# Patient Record
Sex: Male | Born: 1955 | Race: White | Hispanic: No | Marital: Married | Smoking: Never smoker
Health system: Southern US, Community
[De-identification: ages and names within clinical notes are randomized; demographics above are authoritative.]

## PROBLEM LIST (undated history)

## (undated) DIAGNOSIS — I10 Essential (primary) hypertension: Secondary | ICD-10-CM

## (undated) DIAGNOSIS — G4730 Sleep apnea, unspecified: Secondary | ICD-10-CM

## (undated) DIAGNOSIS — F0390 Unspecified dementia without behavioral disturbance: Secondary | ICD-10-CM

## (undated) DIAGNOSIS — M1990 Unspecified osteoarthritis, unspecified site: Secondary | ICD-10-CM

## (undated) DIAGNOSIS — E119 Type 2 diabetes mellitus without complications: Secondary | ICD-10-CM

## (undated) HISTORY — PX: TONSILLECTOMY: SUR1361

## (undated) HISTORY — PX: COLONOSCOPY: SHX174

## (undated) HISTORY — PX: CERVICAL SPINE SURGERY: SHX589

## (undated) HISTORY — PX: HERNIA REPAIR: SHX51

---

## 2003-09-10 ENCOUNTER — Ambulatory Visit (HOSPITAL_COMMUNITY): Admission: RE | Admit: 2003-09-10 | Discharge: 2003-09-11 | Payer: Self-pay | Admitting: Neurosurgery

## 2005-10-21 ENCOUNTER — Emergency Department: Payer: Self-pay | Admitting: Emergency Medicine

## 2005-11-05 ENCOUNTER — Ambulatory Visit: Payer: Self-pay | Admitting: Internal Medicine

## 2009-08-12 ENCOUNTER — Ambulatory Visit: Payer: Self-pay | Admitting: Neurosurgery

## 2009-09-01 ENCOUNTER — Ambulatory Visit: Payer: Self-pay | Admitting: Neurosurgery

## 2009-09-23 ENCOUNTER — Ambulatory Visit: Payer: Self-pay | Admitting: Gastroenterology

## 2009-10-07 ENCOUNTER — Encounter (INDEPENDENT_AMBULATORY_CARE_PROVIDER_SITE_OTHER): Payer: Self-pay | Admitting: Neurosurgery

## 2009-10-07 ENCOUNTER — Ambulatory Visit (HOSPITAL_COMMUNITY): Admission: RE | Admit: 2009-10-07 | Discharge: 2009-10-08 | Payer: Self-pay | Admitting: Neurosurgery

## 2010-09-15 ENCOUNTER — Emergency Department: Payer: Self-pay | Admitting: Emergency Medicine

## 2010-09-16 ENCOUNTER — Emergency Department: Payer: Self-pay | Admitting: Emergency Medicine

## 2010-09-17 ENCOUNTER — Ambulatory Visit: Payer: Self-pay | Admitting: Urology

## 2011-02-02 LAB — BASIC METABOLIC PANEL
BUN: 15 mg/dL (ref 6–23)
CO2: 24 mEq/L (ref 19–32)
Calcium: 9.9 mg/dL (ref 8.4–10.5)
Chloride: 108 mEq/L (ref 96–112)
Creatinine, Ser: 1.01 mg/dL (ref 0.4–1.5)
GFR calc Af Amer: >60 mL/min (ref >60)
GFR calc non Af Amer: >60 mL/min (ref >60)
Glucose, Bld: 83 mg/dL (ref 70–99)
Potassium: 5 mEq/L (ref 3.5–5.1)
Sodium: 139 mEq/L (ref 135–145)

## 2011-02-02 LAB — CBC
HCT: 45.5 % (ref 39.0–52.0)
Hemoglobin: 15.7 g/dL (ref 13.0–17.0)
MCHC: 34.6 g/dL (ref 30.0–36.0)
MCV: 92.9 fL (ref 78.0–100.0)
Platelets: 193 10*3/uL (ref 150–400)
RBC: 4.89 MIL/uL (ref 4.22–5.81)
RDW: 12.6 % (ref 11.5–15.5)
WBC: 5.7 10*3/uL (ref 4.0–10.5)

## 2011-03-19 NOTE — Op Note (Signed)
NAME:  LATRAVION, GRAVES NO.:  [id number]   MEDICAL RECORD NO.:  [id number]                   PATIENT TYPE:  OIB   LOCATION:  2899                                 FACILITY:  [facility]   PHYSICIAN:  Danae Orleans. Venetia Maxon, M.D.               DATE OF BIRTH:  1956-05-21   DATE OF PROCEDURE:  09/10/2003  DATE OF DISCHARGE:                                 OPERATIVE REPORT   PREOPERATIVE DIAGNOSES:  1. Herniated cervical disk with cervical spondylosis.  2. Degenerative disk disease.  3. Cervical radiculopathy C5-6 and C6-7 levels.   POSTOPERATIVE DIAGNOSES:  1. Herniated cervical disk with cervical spondylosis.  2. Degenerative disk disease.  3. Cervical radiculopathy C5-6 and C6-7 levels.   PROCEDURE:  Anterior cervical decompression and fusion C5-6 and C6-7 levels  with Allograft bone graft and anterior cervical plate.   SURGEON:  Danae Orleans. Venetia Maxon, M.D.   ASSISTANT:  Clydene Fake, M.D.   ANESTHESIA:  General endotracheal.   ESTIMATED BLOOD LOSS:  Minimal.   COMPLICATIONS:  None.   DISPOSITION:  To recovery room.   INDICATIONS FOR PROCEDURE:  The patient is a 55 year old man with  significant cervical spondylosis at C5-6 and C6-7 levels with biforaminal  stenosis and bilateral upper extremity pain and weakness that has been  asymptomatic for several years and has gradually gotten worse.  He has done  a variety of nonsurgical treatments and feels it is now interfering with his  ability to function.  It was therefore elected to take him to surgery for  anterior cervical decompression and fusion at the effected levels.   DESCRIPTION OF PROCEDURE:  The patient was brought to the operating room.  Following satisfactory and uncomplicated induction of general endotracheal  anesthesia and placement of intravenous lines, the patient was placed in the  supine position on the operating table.  His neck was placed in slight  extension.  He was placed in 10 pounds  of Holter traction.  His anterior  neck was prepped and draped in the usual sterile fashion.  Area of plain  incision was infiltrated with 0.25% Marcaine and 0.5% Lidocaine with  1:200,000 epinephrine.   An incision was made from the midline to the anterior border of the  sternocleidomastoid muscle.  Dissection was performed exposing the anterior  border of the sternocleidomastoid muscle using blunt dissection.  The  carotid sheath was kept lateral.  The trachea and esophagus were kept medial  exposing the anterior cervical spine.  A bent spinal needle was placed at  what was felt to be the C5-6 level and this was confirmed on intraoperative  x-ray.  Subsequently, the longus coli muscles were taken down from the  anterior cervical spine from C5 through C7 levels bilaterally using  electrocautery and key elevator.  Self-retaining shadow line retractor was  placed along with up-down retractor.  The C5-6 and C6-7 levels were incised  with a 15 blade and disk material was removed in a piecemeal fashion.  Endplates were stripped of residual disk material.  Disk space spreaders  were placed to further decompress the interspace.  The C5-6 level was highly  spondylitic with marked uncinate spurring and this was drilled down using A2  equivalent bur.  High speed drill under direct microscopic visualization.   Subsequently, the posterior longitudinal ligament was incised with an  arachnoid knife and removed in piecemeal fashion and the central spinal cord  dura was decompressed as were both C6 nerve roots.  This was extended up the  neuroforamen.  Hemostasis was obtained with Gelfoam soaked in thrombin.  After trial sizing a 7 mm __________ a cancellous Allograft bone wedge was  then hydrated, inserted in the interspace, and counter sunk appropriately.  Attention was then turned to the C6-7 level where similar decompression was  performed and again both C7 nerve roots were decompressed as was the  central  spinal cord dura.  Hemostasis was again assured and a similarly sized bone  graft was rehydrated, inserted in the interspace, and counter sunk  appropriately.  Subsequently a 40 mm Trinica anterior cervical plate was  affixed to the anterior cervical spine using 14 mm variable angle screws,  two at C5, two at C6, and two at C7. All screws had excellent purchase.  Locking mechanisms were engaged.  Final x-ray confirmed positioning of bone  graft in the anterior cervical plate.   The upper aspect of the construct and the lower aspect were not completely  visualized.  Hemostasis was again assured and the wound was copiously  irrigated with bacitracin and saline.  Soft tissues were inspected and found  to be in good repair.  Subsequently, the platysmal layer was closed with 3-0  Vicryl sutures.  The skin edges were reapproximated with running 4-0 Vicryl  subcuticular stitches.  The wound was dressed with Dermabond.   The patient was extubated in the operating room and taken to the recovery  room in stable and satisfactory condition, having tolerated the procedure  well.  Needle, sponge, and instrument counts correct at the end of the case.                                               Danae Orleans. Venetia Maxon, M.D.    JDS/MEDQ  D:  09/10/2003  T:  09/10/2003  Job:  [id number]

## 2011-12-14 ENCOUNTER — Emergency Department: Payer: Self-pay | Admitting: Emergency Medicine

## 2011-12-14 LAB — CBC
HCT: 43.8 % (ref 40.0–52.0)
HGB: 14.7 g/dL (ref 13.0–18.0)
MCH: 31.2 pg (ref 26.0–34.0)
MCHC: 33.5 g/dL (ref 32.0–36.0)
MCV: 93 fL (ref 80–100)
Platelet: 206 10*3/uL (ref 150–440)
RBC: 4.71 10*6/uL (ref 4.40–5.90)
RDW: 12.5 % (ref 11.5–14.5)
WBC: 7.1 10*3/uL (ref 3.8–10.6)

## 2011-12-14 LAB — COMPREHENSIVE METABOLIC PANEL
Albumin: 4.3 g/dL (ref 3.4–5.0)
Alkaline Phosphatase: 79 U/L (ref 50–136)
Anion Gap: 12 (ref 7–16)
BUN: 19 mg/dL — ABNORMAL HIGH (ref 7–18)
Bilirubin,Total: 0.7 mg/dL (ref 0.2–1.0)
Calcium, Total: 9.4 mg/dL (ref 8.5–10.1)
Chloride: 98 mmol/L (ref 98–107)
Co2: 26 mmol/L (ref 21–32)
Creatinine: 1.14 mg/dL (ref 0.60–1.30)
EGFR (African American): >60
EGFR (Non-African Amer.): >60
Glucose: 351 mg/dL — ABNORMAL HIGH (ref 65–99)
Osmolality: 288 (ref 275–301)
Potassium: 4 mmol/L (ref 3.5–5.1)
SGOT(AST): 23 U/L (ref 15–37)
SGPT (ALT): 32 U/L
Sodium: 136 mmol/L (ref 136–145)
Total Protein: 7.8 g/dL (ref 6.4–8.2)

## 2011-12-14 LAB — URINALYSIS, COMPLETE
Bacteria: NONE SEEN
Bilirubin,UR: NEGATIVE
Blood: NEGATIVE
Glucose,UR: >=500 mg/dL (ref 0–75)
Leukocyte Esterase: NEGATIVE
Nitrite: NEGATIVE
Ph: 5 (ref 4.5–8.0)
Protein: NEGATIVE
RBC,UR: 1 /HPF (ref 0–5)
Specific Gravity: 1.038 (ref 1.003–1.030)
Squamous Epithelial: NONE SEEN
WBC UR: 1 /HPF (ref 0–5)

## 2011-12-14 LAB — TROPONIN I: Troponin-I: <0.02 ng/mL

## 2012-08-29 ENCOUNTER — Ambulatory Visit: Payer: Self-pay | Admitting: Ophthalmology

## 2012-09-11 ENCOUNTER — Ambulatory Visit: Payer: Self-pay | Admitting: Ophthalmology

## 2013-08-20 ENCOUNTER — Ambulatory Visit: Payer: Self-pay | Admitting: Ophthalmology

## 2013-08-20 DIAGNOSIS — I10 Essential (primary) hypertension: Secondary | ICD-10-CM

## 2013-08-27 ENCOUNTER — Ambulatory Visit: Payer: Self-pay | Admitting: Ophthalmology

## 2015-02-18 NOTE — Op Note (Signed)
PATIENT NAME:  Alan Nunez, Alan Nunez MR#:  [id number] DATE OF BIRTH:  11/02/1955  DATE OF PROCEDURE:  09/11/2012  PREOPERATIVE DIAGNOSIS:  Cataract, right eye.   POSTOPERATIVE DIAGNOSIS:  Cataract, right eye.  PROCEDURE PERFORMED:  Extracapsular cataract extraction using phacoemulsification with placement of an Alcon SN6CWS, 18.5-diopter posterior chamber lens, serial # [id number].   SURGEON:  Maylon PeppersSteven A. Christien Frankl, MD  ASSISTANT:  None.  ANESTHESIA:  4% lidocaine and 0.75% Marcaine in a 50/50 mixture with 10 units/mL of Hylenex added, given as peribulbar.  ANESTHESIOLOGIST:  Dr. Dimple Caseyice   COMPLICATIONS:  None.  ESTIMATED BLOOD LOSS:  Less than 1 mL.  DESCRIPTION OF PROCEDURE:  The patient was brought to the operating room and given a peribulbar block.  The patient was then prepped and draped in the usual fashion.  The vertical rectus muscles were imbricated using 5-0 silk sutures.  These sutures were then clamped to the sterile drapes as bridle sutures.  A limbal peritomy was performed extending two clock hours and hemostasis was obtained with cautery.  A partial thickness scleral groove was made at the surgical limbus and dissected anteriorly in a lamellar dissection using an Alcon crescent knife.  The anterior chamber was entered superonasally with a Superblade and through the lamellar dissection with a 2.6 mm keratome.  DisCoVisc was used to replace the aqueous and a continuous tear capsulorrhexis was carried out.  Hydrodissection and hydrodelineation were carried out with balanced salt and a 27 gauge canula.  The nucleus was rotated to confirm the effectiveness of the hydrodissection.  Phacoemulsification was carried out using a divide-and-conquer technique.  Total ultrasound time was 1 minute and 10 seconds with an average power of 18.2 percent, CDE 22.23.  Irrigation/aspiration was used to remove the residual cortex.  DisCoVisc was used to inflate the capsule and the internal incision was  enlarged to 3 mm with the crescent knife.  The intraocular lens was folded and inserted into the capsular bag using the AcrySert delivery system.  Irrigation/aspiration was used to remove the residual DisCoVisc.  Miostat was injected into the anterior chamber through the paracentesis track to inflate the anterior chamber and induce miosis.  The wound was checked for leaks and none were found. The conjunctiva was closed with cautery and the bridle sutures were removed.  Two drops of 0.3% Vigamox were placed on the eye.   An eye shield was placed on the eye.  The patient was discharged to the recovery room in good condition.  ____________________________ Maylon PeppersSteven A. Joseeduardo Brix, MD sad:drc Nunez: 09/11/2012 13:12:45 ET T: 09/11/2012 13:26:16 ET JOB#: [id number]  cc: Viviann SpareSteven A. Jazz Biddy, MD, <Dictator> Erline LevineSTEVEN A Fuller Makin MD ELECTRONICALLY SIGNED 09/25/2012 14:39

## 2015-02-21 NOTE — Op Note (Signed)
PATIENT NAME:  Alan Nunez, Alan Nunez MR#:  [id number] DATE OF BIRTH:  1955/12/30  DATE OF PROCEDURE:  08/27/2013  PREOPERATIVE DIAGNOSIS: Cataract, left eye.   POSTOPERATIVE DIAGNOSIS:  Cataract, left eye.    PROCEDURE PERFORMED: Extracapsular cataract extraction using phacoemulsification with placement of Alcon SN6CWS, 19.0 diopter posterior chamber lens, serial number [id number].   SURGEON: Maylon PeppersSteven A. Ellisyn Icenhower, M.Nunez.   ANESTHESIA: Lidocaine 4% and 0.75% Marcaine, a 50-50 mixture with 10 units/mL of Hylenex added given as a peribulbar.   ANESTHESIOLOGIST: Dr. Darleene CleaverVan Staveren.   COMPLICATIONS: None.   ESTIMATED BLOOD LOSS: Less than 1 mL.   DESCRIPTION OF PROCEDURE:  The patient was brought to the operating room and given a peribulbar block.  The patient was then prepped and draped in the usual fashion.  The vertical rectus muscles were imbricated using 5-0 silk sutures.  These sutures were then clamped to the sterile drapes as bridle sutures.  A limbal peritomy was performed extending two clock hours and hemostasis was obtained with cautery.  A partial thickness scleral groove was made at the surgical limbus and dissected anteriorly in a lamellar dissection using an Alcon crescent knife.  The anterior chamber was entered supero-temporally with a Superblade and through the lamellar dissection with a 2.6 mm keratome.  DisCoVisc was used to replace the aqueous and a continuous tear capsulorrhexis was carried out.  Hydrodissection and hydrodelineation were carried out with balanced salt and a 27 gauge canula.  The nucleus was rotated to confirm the effectiveness of the hydrodissection.  Phacoemulsification was carried out using a divide-and-conquer technique.  Total ultrasound time was 47.3 seconds with an average power of 16%.  CDE 11.24.    Irrigation/aspiration was used to remove the residual cortex.  DisCoVisc was used to inflate the capsule and the internal incision was enlarged to 3 mm with the  crescent knife.  The intraocular lens was folded and inserted into the capsular bag using an AcrySert delivery system.  Irrigation/aspiration was used to remove the residual DisCoVisc.  Miostat was injected into the anterior chamber through the paracentesis track to inflate the anterior chamber and induce miosis.  Cefuroxime 0.1 mL was injected via the paracentesis tract.  The wound was checked for leaks and none were found. The conjunctiva was closed with cautery and the bridle sutures were removed.  Two drops of 0.3% Vigamox were placed on the eye.   An eye shield was placed on the eye.  The patient was discharged to the recovery room in good condition.   ____________________________ Maylon PeppersSteven A. Monika Chestang, MD sad:cs Nunez: 08/27/2013 13:30:09 ET T: 08/27/2013 20:36:06 ET JOB#: [id number]  cc: Viviann SpareSteven A. Kadarius Cuffe, MD, <Dictator> Erline LevineSTEVEN A Eriyanna Kofoed MD ELECTRONICALLY SIGNED 09/03/2013 8:24

## 2018-02-25 ENCOUNTER — Encounter: Payer: Self-pay | Admitting: Emergency Medicine

## 2018-02-25 ENCOUNTER — Emergency Department
Admission: EM | Admit: 2018-02-25 | Discharge: 2018-02-25 | Disposition: A | Discharge status: Home / Self Care | Payer: Managed Care, Other (non HMO) | Attending: Emergency Medicine | Admitting: Emergency Medicine

## 2018-02-25 ENCOUNTER — Other Ambulatory Visit: Payer: Self-pay

## 2018-02-25 ENCOUNTER — Emergency Department: Payer: Managed Care, Other (non HMO)

## 2018-02-25 DIAGNOSIS — I10 Essential (primary) hypertension: Secondary | ICD-10-CM | POA: Diagnosis not present

## 2018-02-25 DIAGNOSIS — E119 Type 2 diabetes mellitus without complications: Secondary | ICD-10-CM | POA: Diagnosis not present

## 2018-02-25 DIAGNOSIS — N200 Calculus of kidney: Secondary | ICD-10-CM

## 2018-02-25 DIAGNOSIS — R1084 Generalized abdominal pain: Secondary | ICD-10-CM | POA: Diagnosis present

## 2018-02-25 HISTORY — DX: Type 2 diabetes mellitus without complications: E11.9

## 2018-02-25 HISTORY — DX: Essential (primary) hypertension: I10

## 2018-02-25 LAB — CBC
HCT: 39.7 % — ABNORMAL LOW (ref 40.0–52.0)
Hemoglobin: 13.4 g/dL (ref 13.0–18.0)
MCH: 30.2 pg (ref 26.0–34.0)
MCHC: 33.7 g/dL (ref 32.0–36.0)
MCV: 89.4 fL (ref 80.0–100.0)
Platelets: 244 10*3/uL (ref 150–440)
RBC: 4.44 MIL/uL (ref 4.40–5.90)
RDW: 13.3 % (ref 11.5–14.5)
WBC: 8.5 10*3/uL (ref 3.8–10.6)

## 2018-02-25 LAB — COMPREHENSIVE METABOLIC PANEL
ALT: 25 U/L (ref 17–63)
AST: 28 U/L (ref 15–41)
Albumin: 4.2 g/dL (ref 3.5–5.0)
Alkaline Phosphatase: 131 U/L — ABNORMAL HIGH (ref 38–126)
Anion gap: 10 (ref 5–15)
BUN: 26 mg/dL — ABNORMAL HIGH (ref 6–20)
CO2: 23 mmol/L (ref 22–32)
Calcium: 9.5 mg/dL (ref 8.9–10.3)
Chloride: 100 mmol/L — ABNORMAL LOW (ref 101–111)
Creatinine, Ser: 1.09 mg/dL (ref 0.61–1.24)
GFR calc Af Amer: >60 mL/min (ref >60)
GFR calc non Af Amer: >60 mL/min (ref >60)
Glucose, Bld: 279 mg/dL — ABNORMAL HIGH (ref 65–99)
Potassium: 4.3 mmol/L (ref 3.5–5.1)
Sodium: 133 mmol/L — ABNORMAL LOW (ref 135–145)
Total Bilirubin: 1 mg/dL (ref 0.3–1.2)
Total Protein: 6.9 g/dL (ref 6.5–8.1)

## 2018-02-25 LAB — URINALYSIS, COMPLETE (UACMP) WITH MICROSCOPIC
Bacteria, UA: NONE SEEN
Bilirubin Urine: NEGATIVE
Glucose, UA: >=500 mg/dL — AB
Ketones, ur: 20 mg/dL — AB
Leukocytes, UA: NEGATIVE
Nitrite: NEGATIVE
Protein, ur: NEGATIVE mg/dL
Specific Gravity, Urine: 1.016 (ref 1.005–1.030)
Squamous Epithelial / LPF: NONE SEEN (ref 0–5)
pH: 6 (ref 5.0–8.0)

## 2018-02-25 LAB — LIPASE, BLOOD: Lipase: 22 U/L (ref 11–51)

## 2018-02-25 MED ORDER — IBUPROFEN 600 MG PO TABS: 600.0000 mg | ORAL_TABLET | Freq: Three times a day (TID) | ORAL | 0 refills | Status: DC | PRN

## 2018-02-25 MED ORDER — HYDROCODONE-ACETAMINOPHEN 5-325 MG PO TABS: 1.0000 | ORAL_TABLET | Freq: Four times a day (QID) | ORAL | 0 refills | Status: DC | PRN

## 2018-02-25 MED ORDER — IOPAMIDOL (ISOVUE-370) INJECTION 76%
75.0000 mL | Freq: Once | INTRAVENOUS | Status: AC | PRN
  Administered 2018-02-25: 75 mL via INTRAVENOUS

## 2018-02-25 MED ORDER — KETOROLAC TROMETHAMINE 30 MG/ML IJ SOLN
15.0000 mg | Freq: Once | INTRAMUSCULAR | Status: AC
  Administered 2018-02-25: 15 mg via INTRAVENOUS
  Filled 2018-02-25: qty 1

## 2018-02-25 MED ORDER — ONDANSETRON HCL 4 MG/2ML IJ SOLN
4.0000 mg | Freq: Once | INTRAMUSCULAR | Status: AC
  Administered 2018-02-25: 4 mg via INTRAVENOUS
  Filled 2018-02-25: qty 2

## 2018-02-25 MED ORDER — FENTANYL CITRATE (PF) 100 MCG/2ML IJ SOLN
75.0000 ug | Freq: Once | INTRAMUSCULAR | Status: AC
  Administered 2018-02-25: 75 ug via INTRAVENOUS
  Filled 2018-02-25: qty 2

## 2018-02-25 MED ORDER — TAMSULOSIN HCL 0.4 MG PO CAPS
0.4000 mg | ORAL_CAPSULE | Freq: Every day | ORAL | 0 refills | Status: DC
Start: 2018-02-25 — End: 2022-12-29

## 2018-02-25 NOTE — ED Notes (Signed)

## 2018-02-25 NOTE — ED Triage Notes (Signed)
Pt to ED via POV c/o severe abdominal pain that started around 0400. Pt states that he has also had N/V, has had 3 episodes of vomiting. Pt states that the pain comes and goes. Pt denies diarrhea. Pt states that he feels like he is constipated. Pt appears uncomfortable at this time but is in NAD.

## 2018-02-25 NOTE — Discharge Instructions (Signed)
Please take Flomax every day and take your pain medication as needed for severe symptoms.  Follow-up with urology within 1 week for any concerns and return to the emergency department for any issues whatsoever.  It was a pleasure to take care of you today, and thank you for coming to our emergency department.  If you have any questions or concerns before leaving please ask the nurse to grab me and I'm more than happy to go through your aftercare instructions again.  If you were prescribed any opioid pain medication today such as Norco, Vicodin, Percocet, morphine, hydrocodone, or oxycodone please make sure you do not drive when you are taking this medication as it can alter your ability to drive safely.  If you have any concerns once you are home that you are not improving or are in fact getting worse before you can make it to your follow-up appointment, please do not hesitate to call [phone] and come back for further evaluation.  Merrily Brittle, MD  Results for orders placed or performed during the hospital encounter of 02/25/18  Lipase, blood  Result Value Ref Range   Lipase 22 11 - 51 U/L  Comprehensive metabolic panel  Result Value Ref Range   Sodium 133 (L) 135 - 145 mmol/L   Potassium 4.3 3.5 - 5.1 mmol/L   Chloride 100 (L) 101 - 111 mmol/L   CO2 23 22 - 32 mmol/L   Glucose, Bld 279 (H) 65 - 99 mg/dL   BUN 26 (H) 6 - 20 mg/dL   Creatinine, Ser 4.54 0.61 - 1.24 mg/dL   Calcium 9.5 8.9 - 09.8 mg/dL   Total Protein 6.9 6.5 - 8.1 g/dL   Albumin 4.2 3.5 - 5.0 g/dL   AST 28 15 - 41 U/L   ALT 25 17 - 63 U/L   Alkaline Phosphatase 131 (H) 38 - 126 U/L   Total Bilirubin 1.0 0.3 - 1.2 mg/dL   GFR calc non Af Amer >60 >60 mL/min   GFR calc Af Amer >60 >60 mL/min   Anion gap 10 5 - 15  CBC  Result Value Ref Range   WBC 8.5 3.8 - 10.6 K/uL   RBC 4.44 4.40 - 5.90 MIL/uL   Hemoglobin 13.4 13.0 - 18.0 g/dL   HCT 11.9 (L) 14.7 - 82.9 %   MCV 89.4 80.0 - 100.0 fL   MCH 30.2 26.0 - 34.0 pg   MCHC 33.7 32.0 - 36.0 g/dL   RDW 56.2 13.0 - 86.5 %   Platelets 244 150 - 440 K/uL  Urinalysis, Complete w Microscopic  Result Value Ref Range   Color, Urine YELLOW (A) YELLOW   APPearance CLEAR (A) CLEAR   Specific Gravity, Urine 1.016 1.005 - 1.030   pH 6.0 5.0 - 8.0   Glucose, UA >=500 (A) NEGATIVE mg/dL   Hgb urine dipstick LARGE (A) NEGATIVE   Bilirubin Urine NEGATIVE NEGATIVE   Ketones, ur 20 (A) NEGATIVE mg/dL   Protein, ur NEGATIVE NEGATIVE mg/dL   Nitrite NEGATIVE NEGATIVE   Leukocytes, UA NEGATIVE NEGATIVE   RBC / HPF 21-50 0 - 5 RBC/hpf   WBC, UA 0-5 0 - 5 WBC/hpf   Bacteria, UA NONE SEEN NONE SEEN   Squamous Epithelial / LPF NONE SEEN 0 - 5   Ct Abdomen Pelvis W Contrast  Result Date: 02/25/2018 CLINICAL DATA:  Pt to ED via POV c/o severe abdominal pain that started around 0400. Pt states that he has also had N/V, has  had 3 episodes of vomiting. Pt states that the pain comes and goes. Pt denies diarrhea. Pt states that he feels like he is constipated. EXAM: CT ABDOMEN AND PELVIS WITH CONTRAST TECHNIQUE: Multidetector CT imaging of the abdomen and pelvis was performed using the standard protocol following bolus administration of intravenous contrast. CONTRAST:  75mL ISOVUE-370 IOPAMIDOL (ISOVUE-370) INJECTION 76% COMPARISON:  09/15/2010 FINDINGS: Lower chest: Clear lung bases. Hepatobiliary: Liver normal in size. Small area of focal fat adjacent to the falciform ligament. No liver masses. There are dependent gallstones. No evidence of acute cholecystitis. No bile duct dilation. Pancreas: Unremarkable. No pancreatic ductal dilatation or surrounding inflammatory changes. Spleen: Normal in size without focal abnormality. Adrenals/Urinary Tract: No adrenal masses. Moderate left hydronephrosis. Left ureter is mildly dilated. Obstruction is due to a 3 mm stone at the left ureterovesicular junction. There is left perinephric stranding. Small nonobstructing stone in the upper pole the left  kidney. Several small nonobstructing stones in the right kidney. No right AND for a CIS. 6 mm low-density mass arises from the upper pole the right kidney consistent with a cyst. No other definite masses. Right ureters normal course and in caliber. Bladder is unremarkable. Stomach/Bowel: Normal stomach and small bowel. Most of the colon is decompressed. There are scattered colonic diverticula. No diverticulitis or other colonic inflammatory process. No increase in colonic stool burden. Normal appendix visualized. Vascular/Lymphatic: Aortic atherosclerosis. No enlarged abdominal or pelvic lymph nodes. Reproductive: Prostate is mildly enlarged. Other: No abdominal wall hernia or abnormality. No abdominopelvic ascites. Musculoskeletal: No fracture or acute finding. No osteoblastic or osteolytic lesions. IMPRESSION: 1. 3 mm stone at the left ureterovesicular junction causes moderate hydronephrosis. 2. No other acute abnormality. 3. Small bilateral nonobstructing intrarenal stones. 4. Gallstones. 5. No increase in colonic stool burden. 6. Scattered colonic diverticula.  No diverticulitis. 7. Mild aortic atherosclerosis. Electronically Signed   By: Amie Portland M.D.   On: 02/25/2018 15:24

## 2018-02-25 NOTE — ED Provider Notes (Signed)
[facility] Emergency Department Provider Note  ____________________________________________   First MD Initiated Contact with Patient 02/25/18 1357     (approximate)  I have reviewed the triage vital signs and the nursing notes.   HISTORY  Chief Complaint Abdominal Pain    HPI Alan Nunez is a 62 y.o. male is self presents to the emergency department with severe abdominal pain that began early this morning around 4 in the morning.  Associated with nausea and vomiting.  The pain is intermittent seems to come and go.  It on his left side radiating towards his groin.  He denies dysuria frequency hesitancy.  He does report some back pain.  He denies fevers or chills.  Past Medical History:  Diagnosis Date  . Diabetes mellitus without complication (HCC)   . Hypertension     There are no active problems to display for this patient.   Past Surgical History:  Procedure Laterality Date  . HERNIA REPAIR      Prior to Admission medications   Medication Sig Start Date End Date Taking? Authorizing Provider  HYDROcodone-acetaminophen (NORCO) 5-325 MG tablet Take 1 tablet by mouth every 6 (six) hours as needed for up to 7 doses for severe pain. 02/25/18   Merrily Brittle, MD  ibuprofen (ADVIL,MOTRIN) 600 MG tablet Take 1 tablet (600 mg total) by mouth every 8 (eight) hours as needed. 02/25/18   Merrily Brittle, MD  tamsulosin (FLOMAX) 0.4 MG CAPS capsule Take 1 capsule (0.4 mg total) by mouth daily. 02/25/18   Merrily Brittle, MD    Allergies Codeine and Morphine and related  No family history on file.  Social History Social History   Tobacco Use  . Smoking status: Never Smoker  . Smokeless tobacco: Never Used  Substance Use Topics  . Alcohol use: Not Currently  . Drug use: Not Currently    Review of Systems Constitutional: No fever/chills Eyes: No visual changes. ENT: No sore throat. Cardiovascular: Denies chest pain. Respiratory: Denies  shortness of breath. Gastrointestinal: Positive for abdominal pain.  Positive for nausea, positive for vomiting.  No diarrhea.  No constipation. Genitourinary: Negative for dysuria. Musculoskeletal: Positive for back pain. Skin: Negative for rash. Neurological: Negative for headaches, focal weakness or numbness.   ____________________________________________   PHYSICAL EXAM:  VITAL SIGNS: ED Triage Vitals  Enc Vitals Group     BP 02/25/18 1147 (!) 171/85     Pulse Rate 02/25/18 1147 66     Resp 02/25/18 1147 16     Temp 02/25/18 1147 99.1 F (37.3 C)     Temp Source 02/25/18 1147 Oral     SpO2 02/25/18 1147 100 %     Weight 02/25/18 1147 150 lb (68 kg)     Height 02/25/18 1147  (1.778 m)     Head Circumference --      Peak Flow --      Pain Score 02/25/18 1153 10     Pain Loc --      Pain Edu? --      Excl. in GC? --     Constitutional: Alert and oriented x4 appears quite uncomfortable nontoxic no diaphoresis speaks full clear sentences Eyes: PERRL EOMI. Head: Atraumatic. Nose: No congestion/rhinnorhea. Mouth/Throat: No trismus Neck: No stridor.   Cardiovascular: Normal rate, regular rhythm. Grossly normal heart sounds.  Good peripheral circulation. Respiratory: Normal respiratory effort.  No retractions. Lungs CTAB and moving good air Gastrointestinal: Soft nondistended nontender no rebound or guarding no peritonitis  no costovertebral tenderness Musculoskeletal: No lower extremity edema   Neurologic:  Normal speech and language. No gross focal neurologic deficits are appreciated. Skin:  Skin is warm, dry and intact. No rash noted. Psychiatric: Mood and affect are normal. Speech and behavior are normal.    ____________________________________________   DIFFERENTIAL includes but not limited to  Urinary tract infection, prostatitis, pyelonephritis, nephrolithiasis, aortic dissection ____________________________________________   LABS (all labs ordered are  listed, but only abnormal results are displayed)  Labs Reviewed  COMPREHENSIVE METABOLIC PANEL - Abnormal; Notable for the following components:      Result Value   Sodium 133 (*)    Chloride 100 (*)    Glucose, Bld 279 (*)    BUN 26 (*)    Alkaline Phosphatase 131 (*)    All other components within normal limits  CBC - Abnormal; Notable for the following components:   HCT 39.7 (*)    All other components within normal limits  URINALYSIS, COMPLETE (UACMP) WITH MICROSCOPIC - Abnormal; Notable for the following components:   Color, Urine YELLOW (*)    APPearance CLEAR (*)    Glucose, UA >=500 (*)    Hgb urine dipstick LARGE (*)    Ketones, ur 20 (*)    All other components within normal limits  LIPASE, BLOOD    Lab work reviewed by me consistent with hematuria __________________________________________  EKG   ____________________________________________  RADIOLOGY  CT stone reviewed by me consistent with 3 mm stone at the left UVJ ____________________________________________   PROCEDURES  Procedure(s) performed: no  Procedures  Critical Care performed: no  Observation: no ____________________________________________   INITIAL IMPRESSION / ASSESSMENT AND PLAN / ED COURSE  Pertinent labs & imaging results that were available during my care of the patient were reviewed by me and considered in my medical decision making (see chart for details).  After Toradol morphine and Zofran the patient's symptoms are nearly completely resolved.  CT scan confirms a 3 mm stone at the left UVJ.  He is able to keep down food down his pain is adequately controlled.  I will treat him with a short course of pain medication and nonsteroidals as well as Flomax to hopefully help it pass.  Outpatient referral to urology.  The patient verbalized understanding and agreed with the plan.      ____________________________________________   FINAL CLINICAL IMPRESSION(S) / ED  DIAGNOSES  Final diagnoses:  Kidney stone      NEW MEDICATIONS STARTED DURING THIS VISIT:  Discharge Medication List as of 02/25/2018  3:48 PM    START taking these medications   Details  HYDROcodone-acetaminophen (NORCO) 5-325 MG tablet Take 1 tablet by mouth every 6 (six) hours as needed for up to 7 doses for severe pain., Starting Sat 02/25/2018, Print    ibuprofen (ADVIL,MOTRIN) 600 MG tablet Take 1 tablet (600 mg total) by mouth every 8 (eight) hours as needed., Starting Sat 02/25/2018, Print    tamsulosin (FLOMAX) 0.4 MG CAPS capsule Take 1 capsule (0.4 mg total) by mouth daily., Starting Sat 02/25/2018, Print         Note:  This document was prepared using Dragon voice recognition software and may include unintentional dictation errors.     Merrily Brittle, MD 03/01/18 0040

## 2019-04-24 IMAGING — CT CT ABD-PELV W/ CM
2 of 5 series · 16 of 46 positions shown, 18 images · IV contrast (APPLIED)
Comparison: 09/15/2010

CLINICAL DATA: Pt to ED via POV c/o severe abdominal pain that
started around 8288. Pt states that he has also had N/V, has had 3
episodes of vomiting. Pt states that the pain comes and goes. Pt
denies diarrhea. Pt states that he feels like he is constipated.

EXAM:
CT ABDOMEN AND PELVIS WITH CONTRAST
TECHNIQUE: Multidetector CT imaging of the abdomen and pelvis was performed
using the standard protocol following bolus administration of
intravenous contrast.
CONTRAST:  75mL [id number] IOPAMIDOL ([id number]) INJECTION 76%

[Series 2: routine abd/pel with · axial · 0.77mm/px · z∈[-496,-106]mm · 13 of 88 slices shown, 15 images]
[im 5/88  soft-tissue]
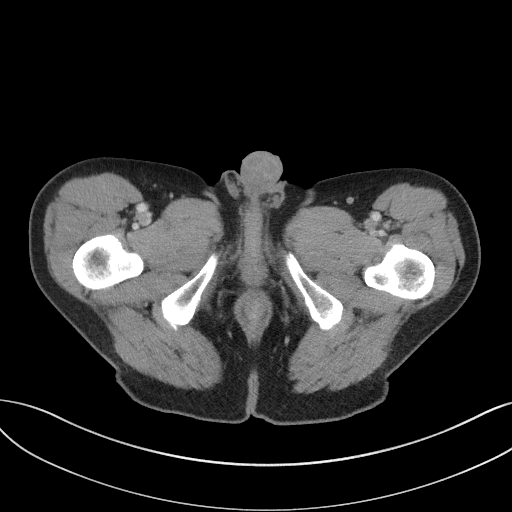
[im 5/88  bone]
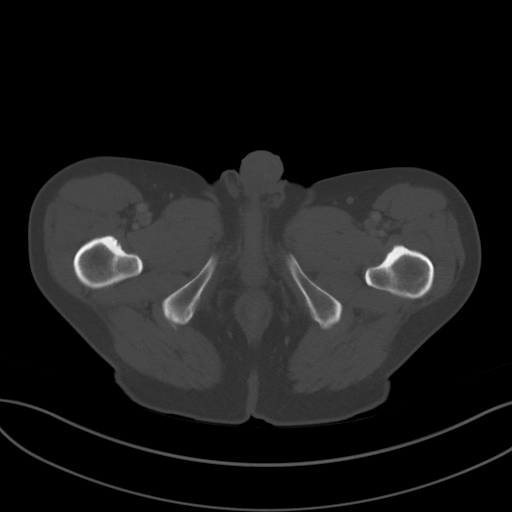
[im 14/88  soft-tissue]
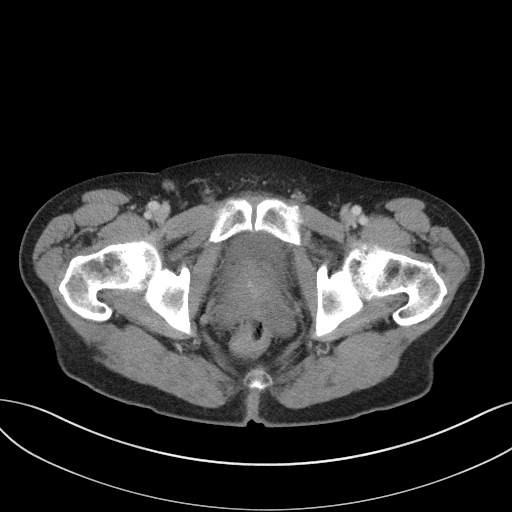
[im 19/88  soft-tissue]
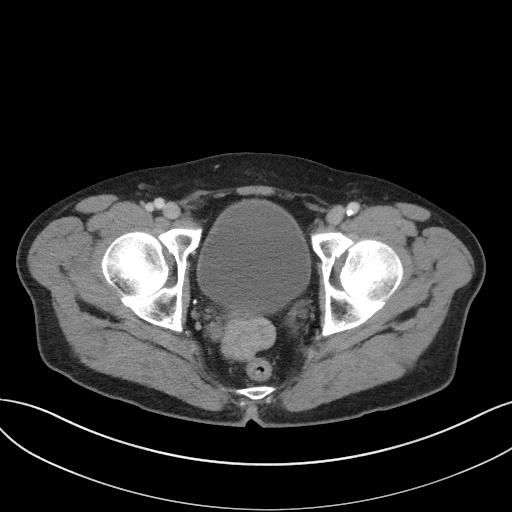
[im 23/88  soft-tissue]
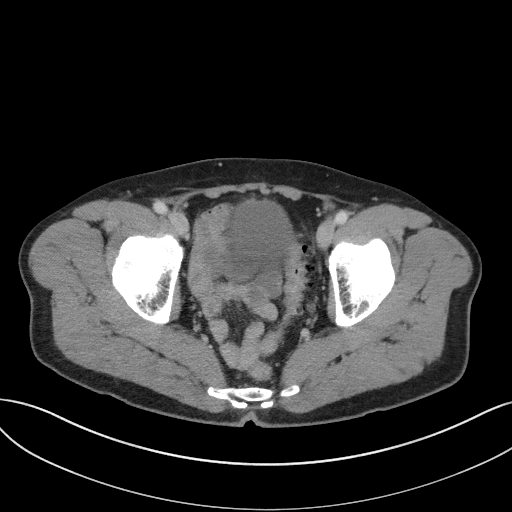
[im 33/88  soft-tissue]
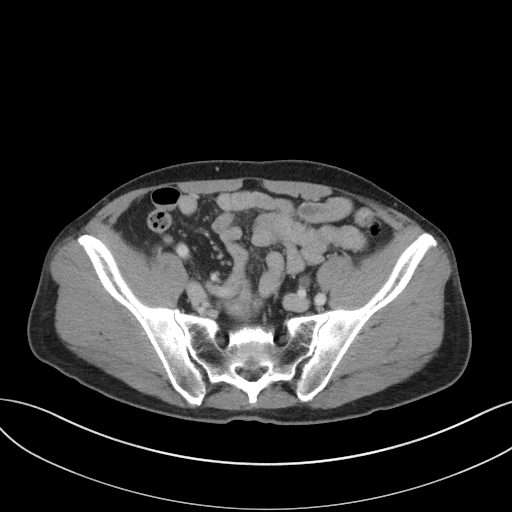
[im 37/88  soft-tissue]
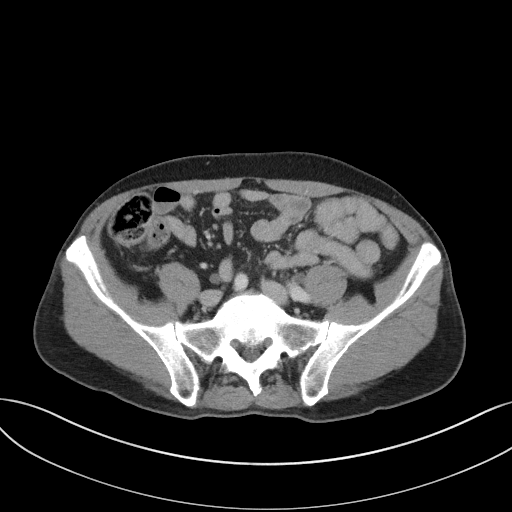
[im 46/88  soft-tissue]
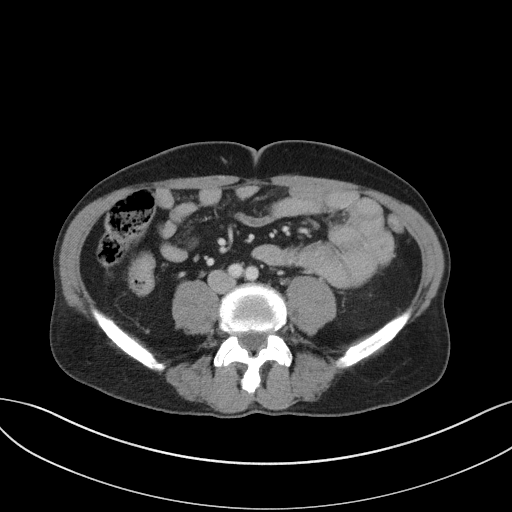
[im 51/88  soft-tissue]
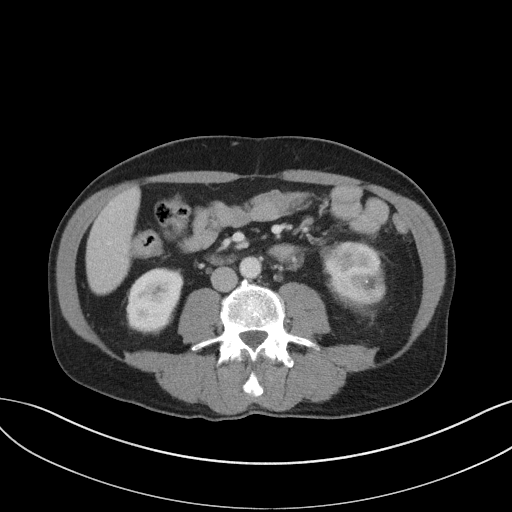
[im 55/88  soft-tissue]
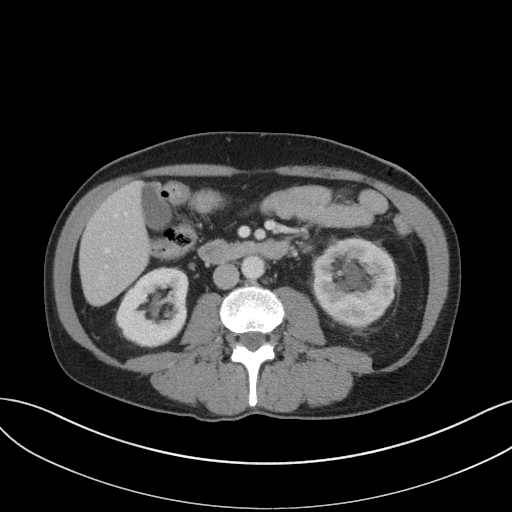
[im 55/88  bone]
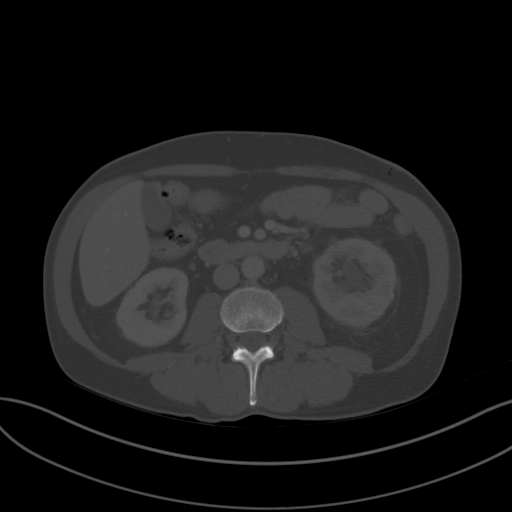
[im 65/88  soft-tissue]
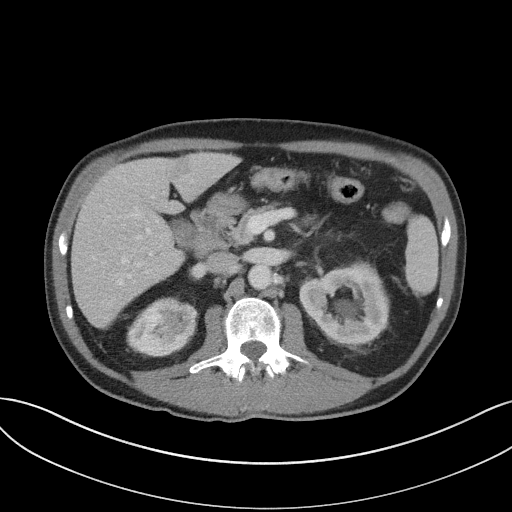
[im 69/88  soft-tissue]
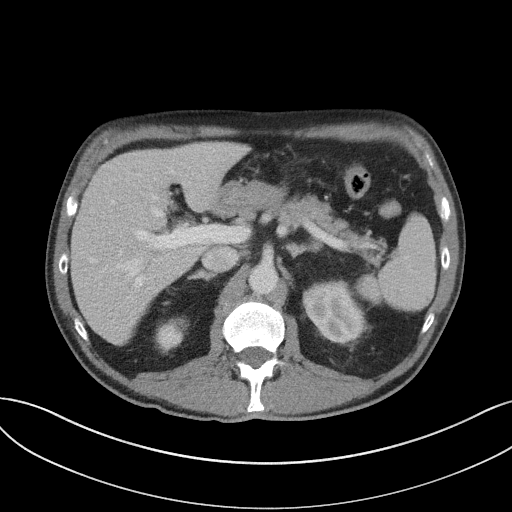
[im 74/88  soft-tissue]
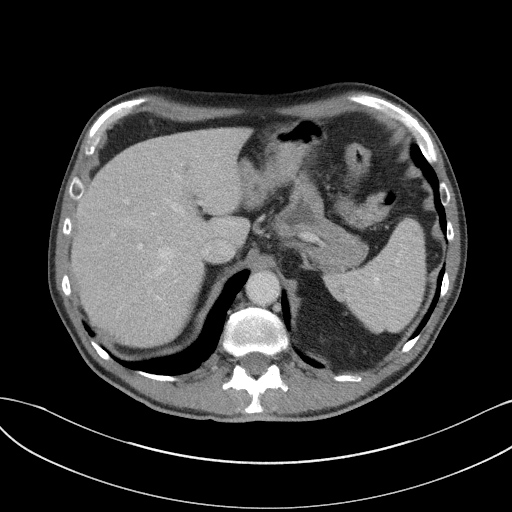
[im 83/88  soft-tissue]
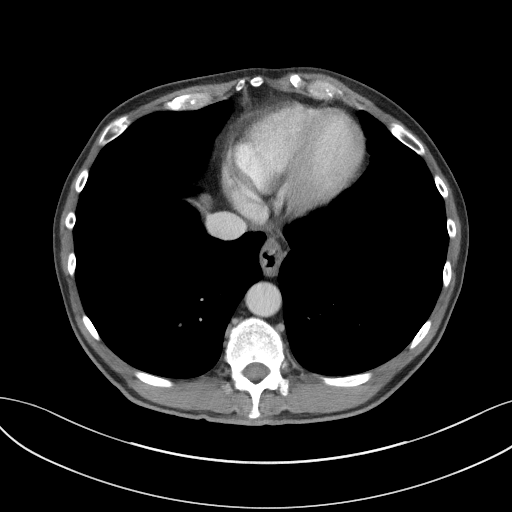

[Series 5: coronal st · coronal · 0.70mm/px · 3 of 92 slices shown]
[im 31/92  soft-tissue]
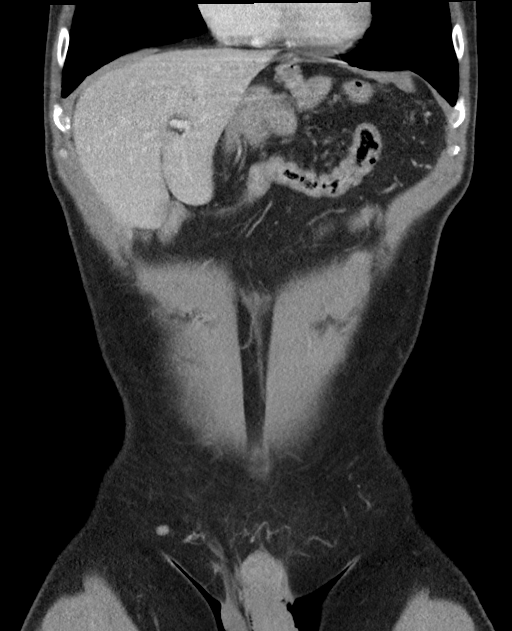
[im 41/92  soft-tissue]
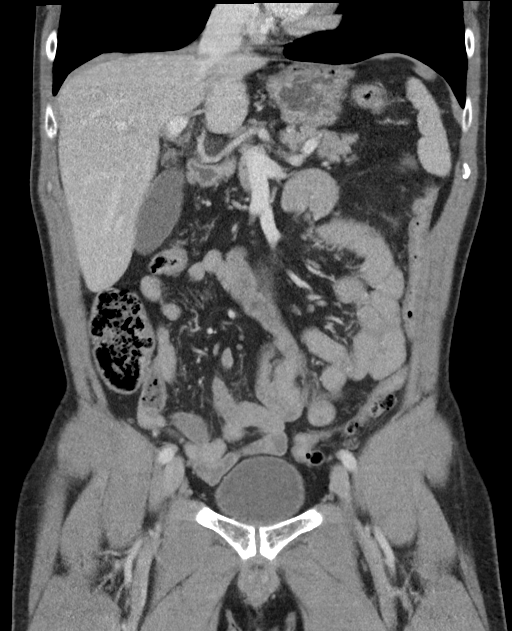
[im 51/92  soft-tissue]
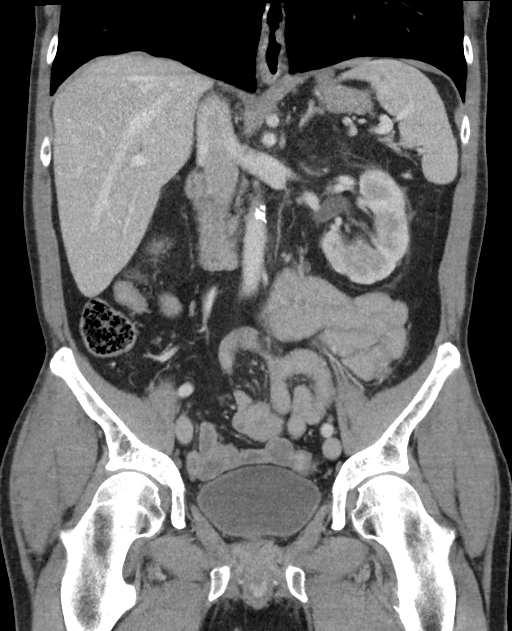

[16 of 46 positions shown; findings below may reference images not displayed]

FINDINGS: Lower chest: Clear lung bases.

Hepatobiliary: Liver normal in size. Small area of focal fat
adjacent to the falciform ligament. No liver masses. There are
dependent gallstones. No evidence of acute cholecystitis. No bile
duct dilation.

Pancreas: Unremarkable. No pancreatic ductal dilatation or
surrounding inflammatory changes.

Spleen: Normal in size without focal abnormality.

Adrenals/Urinary Tract: No adrenal masses.

Moderate left hydronephrosis. Left ureter is mildly dilated.
Obstruction is due to a 3 mm stone at the left ureterovesicular
junction. There is left perinephric stranding. Small nonobstructing
stone in the upper pole the left kidney. Several small
nonobstructing stones in the right kidney. No right AND for a CIS. 6
mm low-density mass arises from the upper pole the right kidney
consistent with a cyst. No other definite masses. Right ureters
normal course and in caliber.

Bladder is unremarkable.

Stomach/Bowel: Normal stomach and small bowel.

Most of the colon is decompressed. There are scattered colonic
diverticula. No diverticulitis or other colonic inflammatory
process. No increase in colonic stool burden.

Normal appendix visualized.

Vascular/Lymphatic: Aortic atherosclerosis. No enlarged abdominal or
pelvic lymph nodes.

Reproductive: Prostate is mildly enlarged.

Other: No abdominal wall hernia or abnormality. No abdominopelvic
ascites.

Musculoskeletal: No fracture or acute finding. No osteoblastic or
osteolytic lesions.
IMPRESSION: 1. 3 mm stone at the left ureterovesicular junction causes moderate
hydronephrosis.
2. No other acute abnormality.
3. Small bilateral nonobstructing intrarenal stones.
4. Gallstones.
5. No increase in colonic stool burden.
6. Scattered colonic diverticula.  No diverticulitis.
7. Mild aortic atherosclerosis.

## 2020-01-19 ENCOUNTER — Ambulatory Visit: Payer: Self-pay | Attending: Internal Medicine

## 2020-01-19 ENCOUNTER — Other Ambulatory Visit: Payer: Self-pay

## 2020-01-19 DIAGNOSIS — Z23 Encounter for immunization: Secondary | ICD-10-CM

## 2020-01-19 NOTE — Progress Notes (Signed)
   Covid-19 Vaccination Clinic  Name:  Alan Nunez    MRN: [id number] DOB: 22-May-1956  01/19/2020  [name] was observed post Covid-19 immunization for 15 minutes without incident. He was provided with Vaccine Information Sheet and instruction to access the V-Safe system.   [name] was instructed to call 911 with any severe reactions post vaccine: Marland Kitchen Difficulty breathing  . Swelling of face and throat  . A fast heartbeat  . A bad rash all over body  . Dizziness and weakness   Immunizations Administered    Name Date Dose VIS Date Route   Pfizer COVID-19 Vaccine 01/19/2020  9:38 AM 0.3 mL 10/12/2019 Intramuscular   Manufacturer: [employer], [employer]   Lot: [id number]   NDC: [address]-8

## 2020-02-12 ENCOUNTER — Ambulatory Visit: Payer: Self-pay | Attending: Internal Medicine

## 2020-02-12 DIAGNOSIS — Z23 Encounter for immunization: Secondary | ICD-10-CM

## 2020-02-12 NOTE — Progress Notes (Signed)
   Covid-19 Vaccination Clinic  Name:  JAQUIL TODT    MRN: [id number] DOB: 1956/04/16  02/12/2020  Mr. Hartshorne was observed post Covid-19 immunization for 15 minutes without incident. He was provided with Vaccine Information Sheet and instruction to access the V-Safe system.   Mr. Holtmeyer was instructed to call 911 with any severe reactions post vaccine: Marland Kitchen Difficulty breathing  . Swelling of face and throat  . A fast heartbeat  . A bad rash all over body  . Dizziness and weakness   Immunizations Administered    Name Date Dose VIS Date Route   Pfizer COVID-19 Vaccine 02/12/2020 12:36 PM 0.3 mL 10/12/2019 Intramuscular   Manufacturer: [employer], [employer]   Lot: [id number]   NDC: [address]-5

## 2020-04-15 ENCOUNTER — Other Ambulatory Visit (HOSPITAL_COMMUNITY): Payer: Self-pay | Admitting: Neurology

## 2020-04-15 ENCOUNTER — Other Ambulatory Visit: Payer: Self-pay | Admitting: Neurology

## 2020-04-15 DIAGNOSIS — R4189 Other symptoms and signs involving cognitive functions and awareness: Secondary | ICD-10-CM

## 2020-05-01 ENCOUNTER — Ambulatory Visit (HOSPITAL_COMMUNITY): Payer: Managed Care, Other (non HMO)

## 2020-06-10 ENCOUNTER — Ambulatory Visit (HOSPITAL_COMMUNITY)
Admission: RE | Admit: 2020-06-10 | Discharge: 2020-06-10 | Disposition: A | Discharge status: Home / Self Care | Payer: Self-pay | Source: Ambulatory Visit | Attending: Neurology | Admitting: Neurology

## 2020-06-10 ENCOUNTER — Other Ambulatory Visit: Payer: Self-pay

## 2020-06-10 DIAGNOSIS — R4189 Other symptoms and signs involving cognitive functions and awareness: Secondary | ICD-10-CM | POA: Insufficient documentation

## 2020-06-10 MED ORDER — GADOBUTROL 1 MMOL/ML IV SOLN
8.0000 mL | Freq: Once | INTRAVENOUS | Status: AC | PRN
  Administered 2020-06-10: 8 mL via INTRAVENOUS

## 2021-06-10 ENCOUNTER — Other Ambulatory Visit: Payer: Self-pay

## 2021-06-10 ENCOUNTER — Ambulatory Visit (INDEPENDENT_AMBULATORY_CARE_PROVIDER_SITE_OTHER): Payer: No Typology Code available for payment source | Admitting: Otolaryngology

## 2021-06-10 DIAGNOSIS — H6123 Impacted cerumen, bilateral: Secondary | ICD-10-CM

## 2021-06-10 NOTE — Progress Notes (Signed)
HPI: Alan Nunez is a 65 y.o. male who presents for evaluation of wax buildup in his ears.  He used to get his ears cleaned on annual basis at [facility] but because of change in insurance he presents here to have his ears cleaned.  He is not complaining of any hearing difficulty but does complain of some tinnitus in the ears..  Past Medical History:  Diagnosis Date   Diabetes mellitus without complication (HCC)    Hypertension    Past Surgical History:  Procedure Laterality Date   HERNIA REPAIR     Social History   Socioeconomic History   Marital status: Married    Spouse name: Not on file   Number of children: Not on file   Years of education: Not on file   Highest education level: Not on file  Occupational History   Not on file  Tobacco Use   Smoking status: Never   Smokeless tobacco: Never  Substance and Sexual Activity   Alcohol use: Not Currently   Drug use: Not Currently   Sexual activity: Not on file  Other Topics Concern   Not on file  Social History Narrative   Not on file   Social Determinants of Health   Financial Resource Strain: Not on file  Food Insecurity: Not on file  Transportation Needs: Not on file  Physical Activity: Not on file  Stress: Not on file  Social Connections: Not on file   No family history on file. Allergies  Allergen Reactions   Codeine Hives   Morphine And Related Hives   Prior to Admission medications   Medication Sig Start Date End Date Taking? Authorizing Provider  HYDROcodone-acetaminophen (NORCO) 5-325 MG tablet Take 1 tablet by mouth every 6 (six) hours as needed for up to 7 doses for severe pain. 02/25/18   Merrily Brittle, MD  ibuprofen (ADVIL,MOTRIN) 600 MG tablet Take 1 tablet (600 mg total) by mouth every 8 (eight) hours as needed. 02/25/18   Merrily Brittle, MD  tamsulosin (FLOMAX) 0.4 MG CAPS capsule Take 1 capsule (0.4 mg total) by mouth daily. 02/25/18   Merrily Brittle, MD     Positive ROS: Otherwise  negative  All other systems have been reviewed and were otherwise negative with the exception of those mentioned in the HPI and as above.  Physical Exam: Constitutional: Alert, well-appearing, no acute distress Ears: External ears without lesions or tenderness. Ear canals have moderate amount of wax buildup in both ear canals that was cleaned with suction and curettes.  TMs are clear bilaterally.. Nasal: External nose without lesions. Clear nasal passages Oral: Oropharynx clear. Neck: No palpable adenopathy or masses Respiratory: Breathing comfortably  Skin: No facial/neck lesions or rash noted.  Cerumen impaction removal  Date/Time: 06/10/2021 10:17 AM Performed by: Drema Halon, MD Authorized by: Drema Halon, MD   Consent:    Consent obtained:  Verbal   Consent given by:  Patient   Risks discussed:  Pain and bleeding Procedure details:    Location:  L ear and R ear   Procedure type: curette and suction   Post-procedure details:    Inspection:  TM intact and canal normal   Hearing quality:  Improved   Procedure completion:  Tolerated well, no immediate complications Comments:     TMs are clear bilaterally.  Assessment: Cerumen buildup  Plan: He will follow-up as needed.  Narda Bonds, MD

## 2022-08-24 DIAGNOSIS — R809 Proteinuria, unspecified: Secondary | ICD-10-CM | POA: Diagnosis not present

## 2022-08-24 DIAGNOSIS — E1129 Type 2 diabetes mellitus with other diabetic kidney complication: Secondary | ICD-10-CM | POA: Diagnosis not present

## 2022-08-24 DIAGNOSIS — E785 Hyperlipidemia, unspecified: Secondary | ICD-10-CM | POA: Diagnosis not present

## 2022-08-24 DIAGNOSIS — E1159 Type 2 diabetes mellitus with other circulatory complications: Secondary | ICD-10-CM | POA: Diagnosis not present

## 2022-08-24 DIAGNOSIS — E1169 Type 2 diabetes mellitus with other specified complication: Secondary | ICD-10-CM | POA: Diagnosis not present

## 2022-08-24 DIAGNOSIS — I152 Hypertension secondary to endocrine disorders: Secondary | ICD-10-CM | POA: Diagnosis not present

## 2022-08-24 DIAGNOSIS — Z794 Long term (current) use of insulin: Secondary | ICD-10-CM | POA: Diagnosis not present

## 2022-08-26 DIAGNOSIS — E1129 Type 2 diabetes mellitus with other diabetic kidney complication: Secondary | ICD-10-CM | POA: Diagnosis not present

## 2022-09-26 DIAGNOSIS — E1129 Type 2 diabetes mellitus with other diabetic kidney complication: Secondary | ICD-10-CM | POA: Diagnosis not present

## 2022-10-26 DIAGNOSIS — E1129 Type 2 diabetes mellitus with other diabetic kidney complication: Secondary | ICD-10-CM | POA: Diagnosis not present

## 2022-11-25 ENCOUNTER — Inpatient Hospital Stay: Payer: BC Managed Care – PPO

## 2022-11-25 ENCOUNTER — Inpatient Hospital Stay
Admission: EM | Admit: 2022-11-25 | Discharge: 2022-11-30 | DRG: 871 | Disposition: A | Discharge status: Home Health | Payer: BC Managed Care – PPO | Attending: Internal Medicine | Admitting: Internal Medicine

## 2022-11-25 DIAGNOSIS — K529 Noninfective gastroenteritis and colitis, unspecified: Secondary | ICD-10-CM | POA: Diagnosis present

## 2022-11-25 DIAGNOSIS — Z1152 Encounter for screening for COVID-19: Secondary | ICD-10-CM | POA: Diagnosis not present

## 2022-11-25 DIAGNOSIS — N179 Acute kidney failure, unspecified: Secondary | ICD-10-CM | POA: Diagnosis present

## 2022-11-25 DIAGNOSIS — I4510 Unspecified right bundle-branch block: Secondary | ICD-10-CM | POA: Diagnosis present

## 2022-11-25 DIAGNOSIS — I3139 Other pericardial effusion (noninflammatory): Secondary | ICD-10-CM | POA: Diagnosis not present

## 2022-11-25 DIAGNOSIS — G9341 Metabolic encephalopathy: Secondary | ICD-10-CM | POA: Diagnosis present

## 2022-11-25 DIAGNOSIS — Z794 Long term (current) use of insulin: Secondary | ICD-10-CM | POA: Diagnosis not present

## 2022-11-25 DIAGNOSIS — E1011 Type 1 diabetes mellitus with ketoacidosis with coma: Principal | ICD-10-CM | POA: Diagnosis present

## 2022-11-25 DIAGNOSIS — G8929 Other chronic pain: Secondary | ICD-10-CM | POA: Diagnosis present

## 2022-11-25 DIAGNOSIS — I2510 Atherosclerotic heart disease of native coronary artery without angina pectoris: Secondary | ICD-10-CM | POA: Diagnosis present

## 2022-11-25 DIAGNOSIS — K858 Other acute pancreatitis without necrosis or infection: Secondary | ICD-10-CM | POA: Diagnosis not present

## 2022-11-25 DIAGNOSIS — E0811 Diabetes mellitus due to underlying condition with ketoacidosis with coma: Secondary | ICD-10-CM | POA: Diagnosis not present

## 2022-11-25 DIAGNOSIS — Z79899 Other long term (current) drug therapy: Secondary | ICD-10-CM | POA: Diagnosis not present

## 2022-11-25 DIAGNOSIS — A419 Sepsis, unspecified organism: Secondary | ICD-10-CM | POA: Diagnosis not present

## 2022-11-25 DIAGNOSIS — I10 Essential (primary) hypertension: Secondary | ICD-10-CM | POA: Diagnosis present

## 2022-11-25 DIAGNOSIS — E1110 Type 2 diabetes mellitus with ketoacidosis without coma: Secondary | ICD-10-CM | POA: Diagnosis present

## 2022-11-25 DIAGNOSIS — K851 Biliary acute pancreatitis without necrosis or infection: Secondary | ICD-10-CM | POA: Diagnosis not present

## 2022-11-25 DIAGNOSIS — J9600 Acute respiratory failure, unspecified whether with hypoxia or hypercapnia: Secondary | ICD-10-CM | POA: Diagnosis not present

## 2022-11-25 DIAGNOSIS — E861 Hypovolemia: Secondary | ICD-10-CM | POA: Diagnosis present

## 2022-11-25 DIAGNOSIS — E1129 Type 2 diabetes mellitus with other diabetic kidney complication: Secondary | ICD-10-CM | POA: Diagnosis not present

## 2022-11-25 DIAGNOSIS — Z885 Allergy status to narcotic agent status: Secondary | ICD-10-CM

## 2022-11-25 DIAGNOSIS — R571 Hypovolemic shock: Secondary | ICD-10-CM | POA: Diagnosis not present

## 2022-11-25 DIAGNOSIS — E860 Dehydration: Secondary | ICD-10-CM | POA: Diagnosis present

## 2022-11-25 DIAGNOSIS — F0280 Dementia in other diseases classified elsewhere without behavioral disturbance: Secondary | ICD-10-CM | POA: Diagnosis present

## 2022-11-25 DIAGNOSIS — G300 Alzheimer's disease with early onset: Secondary | ICD-10-CM | POA: Diagnosis present

## 2022-11-25 DIAGNOSIS — E1111 Type 2 diabetes mellitus with ketoacidosis with coma: Secondary | ICD-10-CM | POA: Diagnosis not present

## 2022-11-25 DIAGNOSIS — R000 Tachycardia, unspecified: Secondary | ICD-10-CM | POA: Diagnosis not present

## 2022-11-25 DIAGNOSIS — K219 Gastro-esophageal reflux disease without esophagitis: Secondary | ICD-10-CM | POA: Diagnosis present

## 2022-11-25 DIAGNOSIS — E1010 Type 1 diabetes mellitus with ketoacidosis without coma: Secondary | ICD-10-CM | POA: Diagnosis not present

## 2022-11-25 DIAGNOSIS — Z7984 Long term (current) use of oral hypoglycemic drugs: Secondary | ICD-10-CM

## 2022-11-25 DIAGNOSIS — E871 Hypo-osmolality and hyponatremia: Secondary | ICD-10-CM | POA: Diagnosis present

## 2022-11-25 DIAGNOSIS — J9601 Acute respiratory failure with hypoxia: Secondary | ICD-10-CM | POA: Diagnosis not present

## 2022-11-25 DIAGNOSIS — K8020 Calculus of gallbladder without cholecystitis without obstruction: Secondary | ICD-10-CM | POA: Diagnosis present

## 2022-11-25 DIAGNOSIS — E875 Hyperkalemia: Secondary | ICD-10-CM | POA: Diagnosis present

## 2022-11-25 LAB — BASIC METABOLIC PANEL
BUN: 52 mg/dL — ABNORMAL HIGH (ref 8–23)
CO2: <7 mmol/L — ABNORMAL LOW (ref 22–32)
Calcium: 9.7 mg/dL (ref 8.9–10.3)
Chloride: 97 mmol/L — ABNORMAL LOW (ref 98–111)
Creatinine, Ser: 2.32 mg/dL — ABNORMAL HIGH (ref 0.61–1.24)
GFR, Estimated: 30 mL/min — ABNORMAL LOW (ref >60)
Glucose, Bld: 862 mg/dL (ref 70–99)
Potassium: 5.8 mmol/L — ABNORMAL HIGH (ref 3.5–5.1)
Sodium: 134 mmol/L — ABNORMAL LOW (ref 135–145)

## 2022-11-25 LAB — MAGNESIUM: Magnesium: 2.9 mg/dL — ABNORMAL HIGH (ref 1.7–2.4)

## 2022-11-25 LAB — CBG MONITORING, ED
Glucose-Capillary: >600 mg/dL (ref 70–99)
Glucose-Capillary: >600 mg/dL (ref 70–99)
Glucose-Capillary: >600 mg/dL (ref 70–99)
Glucose-Capillary: >600 mg/dL (ref 70–99)

## 2022-11-25 LAB — RESP PANEL BY RT-PCR (RSV, FLU A&B, COVID)  RVPGX2
Influenza A by PCR: NEGATIVE
Influenza B by PCR: NEGATIVE
Resp Syncytial Virus by PCR: NEGATIVE
SARS Coronavirus 2 by RT PCR: NEGATIVE

## 2022-11-25 LAB — URINALYSIS, ROUTINE W REFLEX MICROSCOPIC
Bilirubin Urine: NEGATIVE
Glucose, UA: >=500 mg/dL — AB
Ketones, ur: 80 mg/dL — AB
Leukocytes,Ua: NEGATIVE
Nitrite: NEGATIVE
Protein, ur: 30 mg/dL — AB
Specific Gravity, Urine: 1.018 (ref 1.005–1.030)
pH: 5 (ref 5.0–8.0)

## 2022-11-25 LAB — GLUCOSE, CAPILLARY
Glucose-Capillary: 517 mg/dL (ref 70–99)
Glucose-Capillary: 585 mg/dL (ref 70–99)
Glucose-Capillary: >600 mg/dL (ref 70–99)
Glucose-Capillary: >600 mg/dL (ref 70–99)
Glucose-Capillary: >600 mg/dL (ref 70–99)

## 2022-11-25 LAB — COMPREHENSIVE METABOLIC PANEL
ALT: 21 U/L (ref 0–44)
AST: 25 U/L (ref 15–41)
Albumin: 3.7 g/dL (ref 3.5–5.0)
Alkaline Phosphatase: 94 U/L (ref 38–126)
BUN: 55 mg/dL — ABNORMAL HIGH (ref 8–23)
CO2: <7 mmol/L — ABNORMAL LOW (ref 22–32)
Calcium: 9.8 mg/dL (ref 8.9–10.3)
Chloride: 94 mmol/L — ABNORMAL LOW (ref 98–111)
Creatinine, Ser: 2.36 mg/dL — ABNORMAL HIGH (ref 0.61–1.24)
GFR, Estimated: 30 mL/min — ABNORMAL LOW (ref >60)
Glucose, Bld: 863 mg/dL (ref 70–99)
Potassium: 6.7 mmol/L (ref 3.5–5.1)
Sodium: 132 mmol/L — ABNORMAL LOW (ref 135–145)
Total Bilirubin: 2.4 mg/dL — ABNORMAL HIGH (ref 0.3–1.2)
Total Protein: 6.2 g/dL — ABNORMAL LOW (ref 6.5–8.1)

## 2022-11-25 LAB — BLOOD GAS, ARTERIAL
Acid-base deficit: 20.1 mmol/L — ABNORMAL HIGH (ref 0.0–2.0)
Bicarbonate: 10.6 mmol/L — ABNORMAL LOW (ref 20.0–28.0)
FIO2: 30 %
MECHVT: 500 mL
O2 Saturation: 100 %
PEEP: 5 cmH2O
Patient temperature: 33.1
RATE: 18 resp/min
pCO2 arterial: 35 mmHg (ref 32–48)
pH, Arterial: 7.06 — CL (ref 7.35–7.45)
pO2, Arterial: 82 mmHg — ABNORMAL LOW (ref 83–108)

## 2022-11-25 LAB — URINE DRUG SCREEN, QUALITATIVE (ARMC ONLY)
Amphetamines, Ur Screen: NOT DETECTED
Barbiturates, Ur Screen: NOT DETECTED
Benzodiazepine, Ur Scrn: NOT DETECTED
Cannabinoid 50 Ng, Ur ~~LOC~~: NOT DETECTED
Cocaine Metabolite,Ur ~~LOC~~: NOT DETECTED
MDMA (Ecstasy)Ur Screen: NOT DETECTED
Methadone Scn, Ur: NOT DETECTED
Opiate, Ur Screen: NOT DETECTED
Phencyclidine (PCP) Ur S: NOT DETECTED
Tricyclic, Ur Screen: NOT DETECTED

## 2022-11-25 LAB — CBC WITH DIFFERENTIAL/PLATELET
Abs Immature Granulocytes: 0.36 10*3/uL — ABNORMAL HIGH (ref 0.00–0.07)
Basophils Absolute: 0.1 10*3/uL (ref 0.0–0.1)
Basophils Relative: 0 %
Eosinophils Absolute: 0 10*3/uL (ref 0.0–0.5)
Eosinophils Relative: 0 %
HCT: 41.1 % (ref 39.0–52.0)
Hemoglobin: 11.8 g/dL — ABNORMAL LOW (ref 13.0–17.0)
Immature Granulocytes: 2 %
Lymphocytes Relative: 17 %
Lymphs Abs: 3.9 10*3/uL (ref 0.7–4.0)
MCH: 30.6 pg (ref 26.0–34.0)
MCHC: 28.7 g/dL — ABNORMAL LOW (ref 30.0–36.0)
MCV: 106.5 fL — ABNORMAL HIGH (ref 80.0–100.0)
Monocytes Absolute: 1.5 10*3/uL — ABNORMAL HIGH (ref 0.1–1.0)
Monocytes Relative: 7 %
Neutro Abs: 17.1 10*3/uL — ABNORMAL HIGH (ref 1.7–7.7)
Neutrophils Relative %: 74 %
Platelets: 315 10*3/uL (ref 150–400)
RBC: 3.86 MIL/uL — ABNORMAL LOW (ref 4.22–5.81)
RDW: 13.8 % (ref 11.5–15.5)
WBC: 23 10*3/uL — ABNORMAL HIGH (ref 4.0–10.5)
nRBC: 0 % (ref 0.0–0.2)

## 2022-11-25 LAB — BLOOD GAS, VENOUS
Acid-base deficit: 26.5 mmol/L — ABNORMAL HIGH (ref 0.0–2.0)
Bicarbonate: 4.8 mmol/L — ABNORMAL LOW (ref 20.0–28.0)
O2 Saturation: 87.5 %
Patient temperature: 37
pCO2, Ven: 23 mmHg — ABNORMAL LOW (ref 44–60)
pH, Ven: <6.95 — CL (ref 7.25–7.43)
pO2, Ven: 59 mmHg — ABNORMAL HIGH (ref 32–45)

## 2022-11-25 LAB — HEMOGLOBIN A1C
Hgb A1c MFr Bld: 12.8 % — ABNORMAL HIGH (ref 4.8–5.6)
Mean Plasma Glucose: 320.66 mg/dL

## 2022-11-25 LAB — LACTIC ACID, PLASMA: Lactic Acid, Venous: 6 mmol/L (ref 0.5–1.9)

## 2022-11-25 LAB — MRSA NEXT GEN BY PCR, NASAL: MRSA by PCR Next Gen: NOT DETECTED

## 2022-11-25 LAB — TROPONIN I (HIGH SENSITIVITY): Troponin I (High Sensitivity): 55 ng/L — ABNORMAL HIGH (ref <18)

## 2022-11-25 LAB — CK: Total CK: 149 U/L (ref 49–397)

## 2022-11-25 LAB — BETA-HYDROXYBUTYRIC ACID
Beta-Hydroxybutyric Acid: >8 mmol/L — ABNORMAL HIGH (ref 0.05–0.27)
Beta-Hydroxybutyric Acid: >8 mmol/L — ABNORMAL HIGH (ref 0.05–0.27)

## 2022-11-25 LAB — ETHANOL: Alcohol, Ethyl (B): <10 mg/dL (ref <10)

## 2022-11-25 LAB — OSMOLALITY: Osmolality: 368 mOsm/kg (ref 275–295)

## 2022-11-25 LAB — PROCALCITONIN: Procalcitonin: 0.29 ng/mL

## 2022-11-25 LAB — LIPASE, BLOOD: Lipase: 285 U/L — ABNORMAL HIGH (ref 11–51)

## 2022-11-25 LAB — PHOSPHORUS: Phosphorus: 9 mg/dL — ABNORMAL HIGH (ref 2.5–4.6)

## 2022-11-25 MED ORDER — LACTATED RINGERS IV BOLUS
1000.0000 mL | Freq: Once | INTRAVENOUS | Status: AC
  Administered 2022-11-25: 1000 mL via INTRAVENOUS

## 2022-11-25 MED ORDER — CALCIUM GLUCONATE-NACL 1-0.675 GM/50ML-% IV SOLN
1.0000 g | Freq: Once | INTRAVENOUS | Status: AC
  Administered 2022-11-25: 1000 mg via INTRAVENOUS
  Filled 2022-11-25: qty 50

## 2022-11-25 MED ORDER — FENTANYL CITRATE PF 50 MCG/ML IJ SOSY
PREFILLED_SYRINGE | INTRAMUSCULAR | Status: AC
  Filled 2022-11-25: qty 2

## 2022-11-25 MED ORDER — INSULIN ASPART 100 UNIT/ML IJ SOLN
10.0000 [IU] | Freq: Once | INTRAMUSCULAR | Status: AC
  Administered 2022-11-25: 10 [IU] via INTRAVENOUS
  Filled 2022-11-25: qty 1

## 2022-11-25 MED ORDER — HEPARIN SODIUM (PORCINE) 5000 UNIT/ML IJ SOLN
5000.0000 [IU] | Freq: Three times a day (TID) | INTRAMUSCULAR | Status: DC
  Administered 2022-11-25 – 2022-11-29 (×11): 5000 [IU] via SUBCUTANEOUS
  Filled 2022-11-25 (×12): qty 1

## 2022-11-25 MED ORDER — INSULIN REGULAR(HUMAN) IN NACL 100-0.9 UT/100ML-% IV SOLN
INTRAVENOUS | Status: DC
  Administered 2022-11-25: 6 [IU]/h via INTRAVENOUS
  Administered 2022-11-26: 8.5 [IU]/h via INTRAVENOUS
  Administered 2022-11-26: 13 [IU]/h via INTRAVENOUS
  Filled 2022-11-25 (×3): qty 100

## 2022-11-25 MED ORDER — ETOMIDATE 2 MG/ML IV SOLN
INTRAVENOUS | Status: AC
  Administered 2022-11-25: 20 mg via INTRAVENOUS
  Filled 2022-11-25: qty 10

## 2022-11-25 MED ORDER — POLYETHYLENE GLYCOL 3350 17 G PO PACK: 17.0000 g | PACK | Freq: Every day | ORAL | Status: DC | PRN

## 2022-11-25 MED ORDER — FENTANYL CITRATE PF 50 MCG/ML IJ SOSY
25.0000 ug | PREFILLED_SYRINGE | INTRAMUSCULAR | Status: DC | PRN
  Administered 2022-11-26 (×4): 50 ug via INTRAVENOUS
  Administered 2022-11-26: 100 ug via INTRAVENOUS
  Administered 2022-11-27: 50 ug via INTRAVENOUS
  Filled 2022-11-25 (×2): qty 1
  Filled 2022-11-25: qty 2
  Filled 2022-11-25 (×2): qty 1

## 2022-11-25 MED ORDER — MIDAZOLAM HCL 2 MG/2ML IJ SOLN
INTRAMUSCULAR | Status: AC
  Filled 2022-11-25: qty 4

## 2022-11-25 MED ORDER — DEXTROSE IN LACTATED RINGERS 5 % IV SOLN: INTRAVENOUS | Status: DC

## 2022-11-25 MED ORDER — HALOPERIDOL LACTATE 5 MG/ML IJ SOLN: 5.0000 mg | Freq: Once | INTRAMUSCULAR | Status: AC

## 2022-11-25 MED ORDER — SODIUM BICARBONATE 8.4 % IV SOLN
INTRAVENOUS | Status: AC
  Filled 2022-11-25: qty 50

## 2022-11-25 MED ORDER — ALBUTEROL SULFATE (2.5 MG/3ML) 0.083% IN NEBU: 2.5000 mg | INHALATION_SOLUTION | RESPIRATORY_TRACT | Status: DC | PRN

## 2022-11-25 MED ORDER — FENTANYL CITRATE PF 50 MCG/ML IJ SOSY
100.0000 ug | PREFILLED_SYRINGE | Freq: Once | INTRAMUSCULAR | Status: AC
  Administered 2022-11-25: 100 ug via INTRAVENOUS

## 2022-11-25 MED ORDER — ORAL CARE MOUTH RINSE
15.0000 mL | OROMUCOSAL | Status: DC
  Administered 2022-11-25 – 2022-11-27 (×18): 15 mL via OROMUCOSAL
  Filled 2022-11-25 (×5): qty 15

## 2022-11-25 MED ORDER — INSULIN REGULAR BOLUS VIA INFUSION
10.0000 [IU] | Freq: Once | INTRAVENOUS | Status: AC
  Administered 2022-11-25: 10 [IU] via INTRAVENOUS
  Filled 2022-11-25: qty 10

## 2022-11-25 MED ORDER — SODIUM CHLORIDE 0.9 % IV SOLN
3.0000 g | Freq: Two times a day (BID) | INTRAVENOUS | Status: DC
  Administered 2022-11-25 – 2022-11-26 (×2): 3 g via INTRAVENOUS
  Filled 2022-11-25: qty 3
  Filled 2022-11-25: qty 8

## 2022-11-25 MED ORDER — SODIUM BICARBONATE 8.4 % IV SOLN
50.0000 meq | Freq: Once | INTRAVENOUS | Status: AC
  Administered 2022-11-25: 50 meq via INTRAVENOUS
  Filled 2022-11-25: qty 50

## 2022-11-25 MED ORDER — DOCUSATE SODIUM 50 MG/5ML PO LIQD
100.0000 mg | Freq: Two times a day (BID) | ORAL | Status: DC
  Administered 2022-11-26 – 2022-11-29 (×3): 100 mg
  Filled 2022-11-25 (×3): qty 10

## 2022-11-25 MED ORDER — LACTATED RINGERS IV SOLN: INTRAVENOUS | Status: DC

## 2022-11-25 MED ORDER — INSULIN ASPART 100 UNIT/ML IV SOLN: 10.0000 [IU] | Freq: Once | INTRAVENOUS | Status: DC

## 2022-11-25 MED ORDER — DIPHENHYDRAMINE HCL 50 MG/ML IJ SOLN: 50.0000 mg | INTRAMUSCULAR | Status: AC

## 2022-11-25 MED ORDER — ETOMIDATE 2 MG/ML IV SOLN: 20.0000 mg | Freq: Once | INTRAVENOUS | Status: AC

## 2022-11-25 MED ORDER — MIDAZOLAM HCL 2 MG/2ML IJ SOLN
1.0000 mg | INTRAMUSCULAR | Status: DC | PRN
  Administered 2022-11-26 – 2022-11-27 (×8): 2 mg via INTRAVENOUS
  Filled 2022-11-25 (×9): qty 2

## 2022-11-25 MED ORDER — PANTOPRAZOLE SODIUM 40 MG IV SOLR
40.0000 mg | INTRAVENOUS | Status: DC
  Administered 2022-11-25: 40 mg via INTRAVENOUS
  Filled 2022-11-25: qty 10

## 2022-11-25 MED ORDER — DIPHENHYDRAMINE HCL 50 MG/ML IJ SOLN
INTRAMUSCULAR | Status: AC
  Administered 2022-11-25: 50 mg via INTRAVENOUS
  Filled 2022-11-25: qty 1

## 2022-11-25 MED ORDER — STERILE WATER FOR INJECTION IV SOLN
INTRAVENOUS | Status: DC
  Filled 2022-11-25: qty 150
  Filled 2022-11-25: qty 1000
  Filled 2022-11-25: qty 150
  Filled 2022-11-25: qty 1000

## 2022-11-25 MED ORDER — PROPOFOL 1000 MG/100ML IV EMUL
INTRAVENOUS | Status: AC
  Administered 2022-11-25: 30 ug/kg/min via INTRAVENOUS
  Filled 2022-11-25: qty 100

## 2022-11-25 MED ORDER — DOCUSATE SODIUM 100 MG PO CAPS: 100.0000 mg | ORAL_CAPSULE | Freq: Two times a day (BID) | ORAL | Status: DC | PRN

## 2022-11-25 MED ORDER — FENTANYL CITRATE PF 50 MCG/ML IJ SOSY: 25.0000 ug | PREFILLED_SYRINGE | INTRAMUSCULAR | Status: DC | PRN

## 2022-11-25 MED ORDER — VECURONIUM BROMIDE 10 MG IV SOLR
INTRAVENOUS | Status: AC
  Filled 2022-11-25: qty 10

## 2022-11-25 MED ORDER — DEXTROSE 50 % IV SOLN
0.0000 mL | INTRAVENOUS | Status: DC | PRN
  Filled 2022-11-25: qty 50

## 2022-11-25 MED ORDER — POLYETHYLENE GLYCOL 3350 17 G PO PACK
17.0000 g | PACK | Freq: Every day | ORAL | Status: DC
  Administered 2022-11-29: 17 g
  Filled 2022-11-25: qty 1

## 2022-11-25 MED ORDER — FAMOTIDINE 20 MG PO TABS: 20.0000 mg | ORAL_TABLET | Freq: Every day | ORAL | Status: DC

## 2022-11-25 MED ORDER — MIDAZOLAM HCL 2 MG/2ML IJ SOLN: 4.0000 mg | Freq: Once | INTRAMUSCULAR | Status: AC

## 2022-11-25 MED ORDER — LORAZEPAM 2 MG/ML IJ SOLN
2.0000 mg | Freq: Once | INTRAMUSCULAR | Status: AC
  Administered 2022-11-25: 2 mg via INTRAVENOUS
  Filled 2022-11-25: qty 1

## 2022-11-25 MED ORDER — HALOPERIDOL LACTATE 5 MG/ML IJ SOLN
INTRAMUSCULAR | Status: AC
  Administered 2022-11-25: 5 mg via INTRAVENOUS
  Filled 2022-11-25: qty 1

## 2022-11-25 MED ORDER — PROPOFOL 1000 MG/100ML IV EMUL: 0.0000 ug/kg/min | INTRAVENOUS | Status: DC

## 2022-11-25 MED ORDER — CALCIUM GLUCONATE 10 % IV SOLN
1.0000 g | Freq: Once | INTRAVENOUS | Status: AC
  Administered 2022-11-25: 1 g via INTRAVENOUS
  Filled 2022-11-25: qty 10

## 2022-11-25 MED ORDER — ORAL CARE MOUTH RINSE: 15.0000 mL | OROMUCOSAL | Status: DC | PRN

## 2022-11-25 NOTE — ED Notes (Addendum)
2000-LR Bolus Given 2001-123mcg Fentanyl Given 2002-4mg  Versed Given 2002-20mg  Etomidate Given 2004-10mg  Vecuronium Given 2005-Patient Intubated-23 at the lip 2007-Amp of Bicarb Given 2009-OG Placed 2013-71mcg Propofol Started 2015-Foley Catheter Placed 2031-10 unit Insulin Bolus Given

## 2022-11-25 NOTE — ED Notes (Signed)
Md stafford at bedside

## 2022-11-25 NOTE — ED Notes (Signed)
Wife at bedside with patient.

## 2022-11-25 NOTE — ED Notes (Signed)
ICU NP at bedside speaking with wife. States we need to do imaging and due to the way patient is breathing that she would suggest to intubate. Wife states "do whatever you need to and can."

## 2022-11-25 NOTE — ED Notes (Signed)
MD Joni Fears made aware of critical lab results of blood sugar, potassium, and serum osmol at this time.

## 2022-11-25 NOTE — Procedures (Addendum)
Intubation Procedure Note  Alan Nunez  [id number]  April 13, 1956  Date:11/25/22  Time:8:16 PM   Provider Performing:Maribeth Jiles L Rust-Chester    Procedure: Intubation (31500)  Indication(s) Respiratory Failure  Consent Risks of the procedure as well as the alternatives and risks of each were explained to the patient and/or caregiver.  Consent for the procedure was obtained and is signed in the bedside chart   Anesthesia Etomidate, Versed, Fentanyl, and Vecuronium   Time Out Verified patient identification, verified procedure, site/side was marked, verified correct patient position, special equipment/implants available, medications/allergies/relevant history reviewed, required imaging and test results available.   Sterile Technique Usual hand hygeine, masks, and gloves were used   Procedure Description Patient positioned in bed supine.  Sedation given as noted above.  Patient was intubated with endotracheal tube using Glidescope.  View was Grade 1 full glottis .  Number of attempts was 1.  Colorimetric CO2 detector was consistent with tracheal placement.   Complications/Tolerance None; patient tolerated the procedure well. Chest X-ray is ordered to verify placement.   EBL Minimal   Specimen(s) None   Alan Nunez, AGACNP-BC Acute Care Nurse Practitioner [facility]   [phone] / [phone] Please see Amion for pager details.

## 2022-11-25 NOTE — ED Notes (Signed)
ICU NP made aware of critical lab results called from lab

## 2022-11-25 NOTE — ED Notes (Signed)
Report given to ICU RN Leanna Sato. Informed nurse of upcoming intubation and CT scans to be done prior to coming to room assignment. No questions voiced from RN receiving report.

## 2022-11-25 NOTE — H&P (Addendum)
NAME:  Alan Nunez, MRN:  [id number], DOB:  November 30, 1955, LOS: 0 ADMISSION DATE:  11/25/2022, CONSULTATION DATE:  11/25/22 REFERRING MD:  Dr. Joni Fears, CHIEF COMPLAINT:  AMS   History of Present Illness:  67 year old male presenting to [facility] ED from home via EMS for evaluation of altered mental status in the setting of poorly controlled type 2 diabetes mellitus and early onset Alzheimer's.  History provided bedside by spouse. She reports the patient has been feeling " unwell" and fatigued without any specific complaints for the last couple of days.  He has not been interested in eating or drinking normally, she believes he might not have had any nutrition over the last 2 days.  She arrived home to find him extremely altered and called EMS. She does admit she suspects he is inconsistent with his insulin regimen during the week while she is at work.  At his baseline the patient is able to perform all his own ADLs, but has become increasingly forgetful asking questions multiple times.  While he reports to her he is taking his medication, she has noticed that his blood glucose runs high during the week and comes down on the weekends when she is giving him his injections.  She reported 1 possible vomiting episode that EMS noted in the trash can while she was at work today on 11/25/2022.  She denies any complaints from him about fever/chills, nausea/diarrhea/abdominal pain, shortness of breath/productive cough, chest pain, or urinary symptoms.  She denies any history of tobacco use, ETOH or recreational drug use.  ED course: Upon arrival the patient was stuporous but also somewhat agitated and combative.  Workup appears consistent with DKA and dehydration.  Lab work significant for hyperkalemia, AKI, anion gap metabolic acidosis, severe hyperglycemia at 863, pseudo mild hyponatremia, elevated total bilirubin, elevated osmolality, leukocytosis and lactic acidosis, with a beta hydroxy greater than 8.  DKA protocol  started, shifting measures given for hyperkalemia, and low-dose sedatives given for combative agitation. Medications given: calcium gluconate 1g, benadryl, haldol, ativan, insulin 10 units, sodium bicarb 50 meq, insulin drip started, 2 L IV fluid. Initial Vitals: 22, 108, 171/93 & 100% on RA Significant labs: (Labs/ Imaging personally reviewed) I, Domingo Pulse [place], AGACNP-BC, personally viewed and interpreted this ECG. EKG Interpretation: Date: 11/25/2022, EKG Time: 16:37, Rate: 104, Rhythm: Sinus tachycardia, QRS Axis: RAD, Intervals: RBBB/LAFB, mildly prolonged QTc, ST/T Wave abnormalities: Nonspecific T wave inversions, peaked T waves, Narrative Interpretation: Sinus tachycardia with RBBB/LAFB, peaked T waves and nonspecific T wave inversions  Chemistry: Na+:132 > 134, K+: 6.7 > 5.8, BUN/Cr.: 55/2.36, Serum CO2/ AG: <7/not calc, Cl: 94, Phos: 9.0, Mg: 2.9, glucose: 863, T. Bili: 2.4 Hematology: WBC: 23, Hgb: 11.8,  Troponin: 55, Lactic/ PCT: 6.0/ 0.29, COVID-19 & Influenza A/B: negative CK: 149, osmolality: 368, lipase: 285  HA1C: 12.8, Beta-hydroxy: >8 VBG: <6.95/ 23/59/4.8  CXR 11/25/2022: some linear opacity of the left lung base considered to be mild left basilar atelectasis. CT head without contrast 11/25/2022: No acute intracranial findings with stable mild atrophy and small vessel change.  Partial opacification of the left ethmoid air cells with increased fluid in the lower mastoid air cells bilaterally.   CT abdomen and pelvis without contrast 11/25/2022: bilateral left greater than right lower lobe airspace consolidation with air bronchograms most likely due to pneumonia or aspiration.  Calcification in the aortic valve leaflets.  Acute interstitial pancreatitis with minimal nonlocalizing fluid around the head of the pancreas extending to the pancreaticoduodenal groove.  No  peripancreatic abscess or hemorrhage.  Cholelithiasis without evidence of cholecystitis.  Nonobstructive nephro  lithiasis.  Nonspecific enteritis/enteropathy with thickened folds in the left abdominal small bowel and in the pelvic small bowel and mesenteric inflammatory reaction in the left abdomen.  Ascending colitis versus non distention, trace pelvic ascites, diverticulosis without evidence of diverticulitis, prostate a megaly  PCCM consulted for admission due to severe DKA with severe anion gap metabolic acidosis requiring closer monitoring and sodium bicarbonate supplementation.  Upon arrival to ED room patient was unable to follow commands but agitated thrashing in the bed requiring nursing to hold his arms while trying to get lab work.  During my conversation with his spouse the patient became increasingly somnolent eventually a RASS of -5.  Due to concerns for airway protection patient was emergently intubated requiring mechanical ventilatory support.  Pertinent  Medical History  T2DM HTN ED GERD H. Pylori (2010) Mitral valve disorder Chronic pain of the cervical spine Early onset alzheimers OSA?  Significant Hospital Events: Including procedures, antibiotic start and stop dates in addition to other pertinent events   11/25/2022 : Admit to ICU with severe DKA with severe anion gap metabolic acidosis and coma requiring intubation and mechanical ventilatory support.  Interim History / Subjective:  Patient initially agitated but not interactive, after sedatives given in the ED patient became increasingly somnolent unable to protect airway. Plan of care discussed with spouse bedside who confirmed the patient is a full code.  Decision was made to emergently intubate in the ED and placed on mechanical ventilatory support.  All questions and concerns answered at this time.  Objective   Blood pressure 131/77, pulse (!) 109, resp. rate (!) 32, height 5\' 10"  (1.778 m), weight 65 kg, SpO2 98 %.        Intake/Output Summary (Last 24 hours) at 11/25/2022 1908 Last data filed at 11/25/2022 1620 Gross per  24 hour  Intake 300 ml  Output --  Net 300 ml   Filed Weights   11/25/22 1758  Weight: 65 kg    Examination: General: Adult male, critically ill, dehydrated appearing, lying in bed intubated & sedated requiring mechanical ventilation, NAD HEENT: MM pink/dry, anicteric, atraumatic, neck supple Neuro: Initially agitated but unable to follow commands > somnolent RASS of -5, PERRL +2, MAE CV: s1s2 RRR, ST on monitor, no r/m/g Pulm: Regular, non labored on room air initially > PRVC 40% PEEP of 5, breath sounds coarse and diminished-BUL & diminished-BLL GI: soft, flat, non tender, bs x 4 GU: foley in place  with clear yellow urine Skin: no rashes/lesions noted Extremities: warm/dry, pulses + 2 R/P, no edema noted  Resolved Hospital Problem list     Assessment & Plan:  Severe diabetic ketoacidosis  Severe anion gap metabolic acidosis PMHx: Poorly controlled T2DM Patient has a V-go device on.  Discussed with wife she believes this device is from 11/24/2022, but it does appear as though it has been on the skin for quite a while.  I can see liquid inside the cartridge as well.  His wife confirmed this device should be giving him long-acting insulin continuously and then you need to manually click the device to give SSI with meals.  She reports he is not good at remembering to take his SSI coverage, however she also admits that device previously had malfunctioned which might be possible in this case. - DKA protocol initiated -Ordered an additional 2 A of sodium bicarbonate, followed by an infusion - Aggressive IV hydration, when blood  glucose falls below 250 add D5 to IV fluids - Insulin drip ordered per Endo tool protocol - Q 4 BMP, closely monitor potassium, replace electrolytes PRN - monitor blood glucose every 1 hour, per Endo tool protocol - trend serum CO2/ AG/ Lactic - Diabetes coordinator consult - Resume home insulin regiment once appropriate  Acute Respiratory Failure secondary  to acute encephalopathy & aspiration in the setting of DKA and sedative administration  Suspect aspiration pneumonia - Ventilator settings: PRVC  8 mL/kg, 40% FiO2, 5 PEEP, continue ventilator support & lung protective strategies - Wean PEEP & FiO2 as tolerated, maintain SpO2 > 90% - Head of bed elevated 30 degrees, VAP protocol in place - Plateau pressures less than 30 cm H20  - Intermittent chest x-ray & ABG PRN - Daily WUA with SBT as tolerated  - Ensure adequate pulmonary hygiene  - F/u cultures, trend PCT -Initiate aspiration Pna coverage unasyn - Bronchodilators PRN - PAD protocol in place: continue Fentanyl IVP & Propofol drip  Suspected Sepsis without septic shock  Suspected aspiration  Lactic: 6, Baseline PCT: 0.29, UA: + Ketones & protein, CT: Left lower lobe consolidation & acute interstitial pancreatitis Initial interventions/workup included: 2 L of NS/LR -Additional 2 L of LR bolus ordered - f/u cultures, trend lactic/ PCT - Daily CBC, monitor WBC/ fever curve - IV antibiotics: unasyn - IVF hydration per DKA protocol as above  Acute interstitial pancreatitis without necrotizing features or abscess - Trend pancreatic enzymes & hepatic function - NPO, consider post-pyloric TF  - continue aggressive IVF resuscitation - f/u lipase, amylase, triglycerides  Small Pericardial Effusion & calcification on aortic leaflets noted on CT imaging RBBB noted on EKG Mildly elevated troponin secondary to NSTEMI versus demand ischemia Spouse reports no previous cardiac history besides hypertension.  No previous echo in epic. -Troponin checked mildly elevated at 55 > will trend - Initiate systemic anticoagulation as needed, as long as gastric occult is negative -Echocardiogram ordered -Continuous cardiac monitoring -Consider cardiology consult depending on echocardiogram results -Outpatient antihypertensives on hold given continuous sedatives  Acute Kidney Injury secondary to  hypovolemia and dehydration in the setting of DKA Hyperkalemia Pseudo-hyponatremia secondary to hyperglycemia Baseline Cr: 1.09, Cr on admission: 2.36 -Patient received 1 round of shifting measures, recheck potassium still elevated at 5.8 and EKG showing peaked T waves.  Given insulin drip is currently not a being increased above 6 units /hour due to endotube protocol and sugar readings > 600, will provide another round of shifting measures including insulin 10 units-additional bicarb as above-and 1 g of calcium gluconate.  Repeat labs at midnight. - Strict I/O's: alert provider if UOP < 0.5 mL/kg/hr - gentle IVF hydration  - Daily BMP, replace electrolytes PRN - Avoid nephrotoxic agents as able, ensure adequate renal perfusion  Query GIB Emesis Dark- almost coffee ground - PPI for GI prophylaxis - gastric occult ordered  Acute encephalopathy suspect metabolic Early onset Alzheimer's At reported baseline the patient is independent of ADLs but forgetful -Continue Aricept if gastric occult is negative -Supportive care -TOC consult to assist with discharge planning as wife is in need of at least home health support.  Best Practice (right click and "Reselect all SmartList Selections" daily)  Diet/type: NPO DVT prophylaxis: prophylactic heparin  GI prophylaxis: PPI Lines: N/A Foley:  Yes, and it is still needed Code Status:  full code Last date of multidisciplinary goals of care discussion [11/25/2022]  Labs   CBC: Recent Labs  Lab 11/25/22 1700  WBC 23.0*  NEUTROABS 17.1*  HGB 11.8*  HCT 41.1  MCV 106.5*  PLT 315    Basic Metabolic Panel: Recent Labs  Lab 11/25/22 1700  NA 132*  K 6.7*  CL 94*  CO2 <7*  GLUCOSE 863*  BUN 55*  CREATININE 2.36*  CALCIUM 9.8   GFR: Estimated Creatinine Clearance: 28.7 mL/min (A) (by C-G formula based on SCr of 2.36 mg/dL (H)). Recent Labs  Lab 11/25/22 1700  WBC 23.0*    Liver Function Tests: Recent Labs  Lab 11/25/22 1700   AST 25  ALT 21  ALKPHOS 94  BILITOT 2.4*  PROT 6.2*  ALBUMIN 3.7   No results for input(s): "LIPASE", "AMYLASE" in the last 168 hours. No results for input(s): "AMMONIA" in the last 168 hours.  ABG    Component Value Date/Time   HCO3 4.8 (L) 11/25/2022 1838   ACIDBASEDEF 26.5 (H) 11/25/2022 1838   O2SAT 87.5 11/25/2022 1838     Coagulation Profile: No results for input(s): "INR", "PROTIME" in the last 168 hours.  Cardiac Enzymes: No results for input(s): "CKTOTAL", "CKMB", "CKMBINDEX", "TROPONINI" in the last 168 hours.  HbA1C: No results found for: "HGBA1C"  CBG: Recent Labs  Lab 11/25/22 1639 11/25/22 1832  GLUCAP >600* >600*    Review of Systems:   UTA- patient somnolent, unable to participate in interview  Past Medical History:  He,  has a past medical history of Diabetes mellitus without complication (HCC) and Hypertension.   Surgical History:   Past Surgical History:  Procedure Laterality Date   HERNIA REPAIR       Social History:   reports that he has never smoked. He has never used smokeless tobacco. He reports that he does not currently use alcohol. He reports that he does not currently use drugs.   Family History:  His family history is not on file.   Allergies Allergies  Allergen Reactions   Codeine Hives   Morphine And Related Hives     Home Medications  Prior to Admission medications   Medication Sig Start Date End Date Taking? Authorizing Provider  insulin aspart (NOVOLOG) 100 UNIT/ML injection Inject 76 Units into the skin daily. In V-Go delivery system 11/27/21  Yes [provider]  insulin degludec (TRESIBA) 100 UNIT/ML FlexTouch Pen Inject 12 Units into the skin at bedtime. 08/24/22  Yes [provider]  donepezil (ARICEPT) 5 MG tablet Take 5 mg by mouth daily.    [provider]  HYDROcodone-acetaminophen (NORCO) 5-325 MG tablet Take 1 tablet by mouth every 6 (six) hours as needed for up to 7 doses for  severe pain. 02/25/18   Merrily Brittle, MD  ibuprofen (ADVIL,MOTRIN) 600 MG tablet Take 1 tablet (600 mg total) by mouth every 8 (eight) hours as needed. 02/25/18   Merrily Brittle, MD  metFORMIN (GLUCOPHAGE) 1000 MG tablet Take 1,000 mg by mouth 2 (two) times daily.    [provider]  tamsulosin (FLOMAX) 0.4 MG CAPS capsule Take 1 capsule (0.4 mg total) by mouth daily. 02/25/18   Merrily Brittle, MD     Critical care time: 69 minutes       Betsey Holiday, AGACNP-BC Acute Care Nurse Practitioner [facility]   [phone] / [phone] Please see Amion for pager details.

## 2022-11-25 NOTE — ED Notes (Signed)
Pt to CT at this time with Edison Nasuti, RN and RT. Leanna Sato, RN made aware that patient will be going to CT then will be coming to room assignment.

## 2022-11-25 NOTE — ED Triage Notes (Signed)
Called out for AMS. Wife states he has hx DM and has not been acting right since yesterday. Cbg with ems read "high". Pt altered and very labored rr noted upon arrival

## 2022-11-25 NOTE — Progress Notes (Signed)
Pt tube advance at this time from 23cm to 26 cm at the lip.

## 2022-11-25 NOTE — Progress Notes (Signed)
eLink Physician-Brief Progress Note Patient Name: Alan Nunez DOB: 1956/05/19 MRN: [id number]   Date of Service  11/25/2022  HPI/Events of Note  Patient admitted with DKA.  eICU Interventions  New Patient Evaluation.        Frederik Pear 11/25/2022, 10:21 PM

## 2022-11-25 NOTE — ED Notes (Signed)
MD made aware of critical [place]

## 2022-11-25 NOTE — Progress Notes (Signed)
Pt transported from ER 6 to CT scan and CT scan to ICU 18 via vent without incident.

## 2022-11-25 NOTE — ED Provider Notes (Signed)
[facility] Provider Note    Event Date/Time   First MD Initiated Contact with Patient 11/25/22 1914     (approximate)   History   Chief Complaint: Altered Mental Status (Called out for AMS. Wife states he has hx DM and has not been acting right since yesterday. Cbg with ems read "high")   HPI  Alan Nunez is a 67 y.o. male with a history of insulin-dependent diabetes who is brought to the ED due to confusion and low energy level since yesterday.  Patient not able to provide any history, wife is arrived to bedside and states no falls, no fever, no vomiting or diarrhea at home.  She suspects that he has forgotten his [place] injection for the last few days.     Physical Exam   Triage Vital Signs: ED Triage Vitals  Enc Vitals Group     BP 11/25/22 1655 (!) 186/92     Pulse Rate 11/25/22 1655 (!) 111     Resp 11/25/22 1655 (!) 22     Temp 11/25/22 1915 97.9 F (36.6 C)     Temp Source 11/25/22 1915 Axillary     SpO2 11/25/22 1620 100 %     Weight 11/25/22 1758 143 lb 4.8 oz (65 kg)     Height 11/25/22 1758 5\' 10"  (1.778 m)     Head Circumference --      Peak Flow --      Pain Score --      Pain Loc --      Pain Edu? --      Excl. in [place]? --     Most recent vital signs: Vitals:   11/25/22 1900 11/25/22 1915  BP: 135/65   Pulse: (!) 113   Resp: 20   Temp:  97.9 F (36.6 C)  SpO2: 97%     General: Not alert, not oriented.  GCS equals E2V2M5 = 9 CV:  Good peripheral perfusion.  Tachycardia heart rate 110.  Symmetric distal pulses. Resp:  Normal effort.  Clear to auscultation bilaterally.  kussmaul respirations. Abd:  No distention.  Soft nontender Other:  Mottled extremities.  Dry mucous membranes.  Empty insulin pump on right lower abdomen   ED Results / Procedures / Treatments   Labs (all labs ordered are listed, but only abnormal results are displayed) Labs Reviewed  BETA-HYDROXYBUTYRIC ACID - Abnormal; Notable for the following  components:      Result Value   Beta-Hydroxybutyric Acid >8.00 (*)    All other components within normal limits  CBC WITH DIFFERENTIAL/PLATELET - Abnormal; Notable for the following components:   WBC 23.0 (*)    RBC 3.86 (*)    Hemoglobin 11.8 (*)    MCV 106.5 (*)    MCHC 28.7 (*)    Neutro Abs 17.1 (*)    Monocytes Absolute 1.5 (*)    Abs Immature Granulocytes 0.36 (*)    All other components within normal limits  OSMOLALITY - Abnormal; Notable for the following components:   Osmolality 368 (*)    All other components within normal limits  COMPREHENSIVE METABOLIC PANEL - Abnormal; Notable for the following components:   Sodium 132 (*)    Potassium 6.7 (*)    Chloride 94 (*)    CO2 <7 (*)    Glucose, Bld 863 (*)    BUN 55 (*)    Creatinine, Ser 2.36 (*)    Total Protein 6.2 (*)    Total Bilirubin 2.4 (*)  GFR, Estimated 30 (*)    All other components within normal limits  BLOOD GAS, VENOUS - Abnormal; Notable for the following components:   pH, Ven <6.95 (*)    pCO2, Ven 23 (*)    pO2, Ven 59 (*)    Bicarbonate 4.8 (*)    Acid-base deficit 26.5 (*)    All other components within normal limits  CBG MONITORING, ED - Abnormal; Notable for the following components:   Glucose-Capillary >600 (*)    All other components within normal limits  CBG MONITORING, ED - Abnormal; Notable for the following components:   Glucose-Capillary >600 (*)    All other components within normal limits  CBG MONITORING, ED - Abnormal; Notable for the following components:   Glucose-Capillary >600 (*)    All other components within normal limits  RESP PANEL BY RT-PCR (RSV, FLU A&B, COVID)  RVPGX2  ETHANOL  BETA-HYDROXYBUTYRIC ACID  URINALYSIS, ROUTINE W REFLEX MICROSCOPIC  BLOOD GAS, VENOUS  HIV ANTIBODY (ROUTINE TESTING W REFLEX)  CBC  BASIC METABOLIC PANEL  MAGNESIUM  PHOSPHORUS  BASIC METABOLIC PANEL  BASIC METABOLIC PANEL  BASIC METABOLIC PANEL  BASIC METABOLIC PANEL  HEMOGLOBIN  A1C  PROCALCITONIN  CK  URINE DRUG SCREEN, QUALITATIVE ([facility] ONLY)  MAGNESIUM  PROCALCITONIN  PHOSPHORUS  LACTIC ACID, PLASMA     EKG Interpreted by me Sinus tachycardia rate 104.  Left axis, normal intervals.  Right bundle branch block.  No acute ischemic changes.  Peaked T waves.   RADIOLOGY CT head interpreted by me, unremarkable.  Radiology report reviewed.   PROCEDURES:  .Critical Care  Performed by: Sharman Cheek, MD Authorized by: Sharman Cheek, MD   Critical care provider statement:    Critical care time (minutes):  35   Critical care time was exclusive of:  Separately billable procedures and treating other patients   Critical care was necessary to treat or prevent imminent or life-threatening deterioration of the following conditions:  Dehydration, endocrine crisis and metabolic crisis   Critical care was time spent personally by me on the following activities:  Development of treatment plan with patient or surrogate, discussions with consultants, evaluation of patient's response to treatment, examination of patient, obtaining history from patient or surrogate, ordering and performing treatments and interventions, ordering and review of laboratory studies, ordering and review of radiographic studies, pulse oximetry, re-evaluation of patient's condition and review of old charts   Care discussed with: admitting provider   Comments:        .1-3 Lead EKG Interpretation  Performed by: Sharman Cheek, MD Authorized by: Sharman Cheek, MD     Interpretation: abnormal     ECG rate:  110   ECG rate assessment: tachycardic     Rhythm: sinus tachycardia     Ectopy: none     Conduction: normal      MEDICATIONS ORDERED IN ED: Medications  insulin regular, human (MYXREDLIN) 100 units/ 100 mL infusion (6 Units/hr Intravenous New Bag/Given 11/25/22 1834)  dextrose 50 % solution 0-50 mL (has no administration in time range)  dextrose 5 % in lactated ringers  infusion (0 mLs Intravenous Hold 11/25/22 1857)  docusate sodium (COLACE) capsule 100 mg (has no administration in time range)  polyethylene glycol (MIRALAX / GLYCOLAX) packet 17 g (has no administration in time range)  heparin injection 5,000 Units (has no administration in time range)  sodium bicarbonate 150 mEq in sterile water 1,150 mL infusion (has no administration in time range)  haloperidol lactate (HALDOL) injection 5  mg (5 mg Intravenous Given 11/25/22 1644)  LORazepam (ATIVAN) injection 2 mg (2 mg Intravenous Given 11/25/22 1818)  diphenhydrAMINE (BENADRYL) injection 50 mg (50 mg Intravenous Given 11/25/22 1646)  calcium gluconate inj 10% (1 g) URGENT USE ONLY! (1 g Intravenous Given 11/25/22 1823)  sodium bicarbonate injection 50 mEq (50 mEq Intravenous Given 11/25/22 1821)  insulin aspart (novoLOG) injection 10 Units (10 Units Intravenous Given 11/25/22 1851)  sodium bicarbonate injection 50 mEq (50 mEq Intravenous Given 11/25/22 1932)     IMPRESSION / MDM / ASSESSMENT AND PLAN / ED COURSE  I reviewed the triage vital signs and the nursing notes.                              Differential diagnosis includes, but is not limited to, DKA, AKI, hyperkalemia, dehydration  Patient's presentation is most consistent with acute presentation with potential threat to life or bodily function.  Patient presents stuporous, appearing very dehydrated, suspect metabolic acidosis from DKA.  He did require Haldol, Benadryl, Ativan due to agitation and combativeness on arrival.  No evidence of trauma.  Patient given 2 L IV fluid bolus along with insulin infusion, calcium and bicarb and insulin bolus for hyperkalemia.  Discussed with ICU team who will plan to admit.       FINAL CLINICAL IMPRESSION(S) / ED DIAGNOSES   Final diagnoses:  Type 1 diabetes mellitus with ketoacidotic coma ([place])     Rx / DC Orders   ED Discharge Orders     None        Note:  This document was prepared using  Dragon voice recognition software and may include unintentional dictation errors.   Carrie Mew, MD 11/25/22 [phone]

## 2022-11-25 NOTE — ED Notes (Signed)
CT contacted to prepare for patient coming for scans

## 2022-11-25 NOTE — Consult Note (Signed)
Pharmacy Antibiotic Note  Alan Nunez is a 67 y.o. male admitted on 11/25/2022 with pneumonia.  Pharmacy has been consulted for unasyn dosing.  Plan: Unaysn 3g IV every 12 hours Continue to monitor and dose adjust antibiotics according to renal function and indication   Height: 5\' 10"  (177.8 cm) Weight: 65 kg (143 lb 4.8 oz) IBW/kg (Calculated) : 73  Temp (24hrs), Avg:97.9 F (36.6 C), Min:97.9 F (36.6 C), Max:97.9 F (36.6 C)  Recent Labs  Lab 11/25/22 1700 11/25/22 1931  WBC 23.0*  --   CREATININE 2.36* 2.32*  LATICACIDVEN  --  6.0*    Estimated Creatinine Clearance: 29.2 mL/min (A) (by C-G formula based on SCr of 2.32 mg/dL (H)).    Allergies  Allergen Reactions   Codeine Hives   Morphine And Related Hives    Antimicrobials this admission: 1/25 unasyn >>    Microbiology results: 1/25 MRSA PCR: sent  Thank you for allowing pharmacy to be a part of this patient's care.  Darrick Penna 11/25/2022 9:55 PM

## 2022-11-25 NOTE — ED Notes (Signed)
External purewick placed on patient

## 2022-11-25 NOTE — ED Notes (Signed)
Pt. Having apnic episodes, Dr. Joni Fears notified.

## 2022-11-26 ENCOUNTER — Inpatient Hospital Stay (HOSPITAL_COMMUNITY)
Admit: 2022-11-26 | Discharge: 2022-11-26 | Disposition: A | Discharge status: Home / Self Care | Payer: BC Managed Care – PPO | Attending: Pulmonary Disease | Admitting: Pulmonary Disease

## 2022-11-26 DIAGNOSIS — J9601 Acute respiratory failure with hypoxia: Secondary | ICD-10-CM

## 2022-11-26 DIAGNOSIS — E1129 Type 2 diabetes mellitus with other diabetic kidney complication: Secondary | ICD-10-CM | POA: Diagnosis not present

## 2022-11-26 DIAGNOSIS — I3139 Other pericardial effusion (noninflammatory): Secondary | ICD-10-CM | POA: Diagnosis not present

## 2022-11-26 LAB — BASIC METABOLIC PANEL
Anion gap: 23 — ABNORMAL HIGH (ref 5–15)
Anion gap: 19 — ABNORMAL HIGH (ref 5–15)
Anion gap: 12 (ref 5–15)
Anion gap: 10 (ref 5–15)
Anion gap: 8 (ref 5–15)
BUN: 49 mg/dL — ABNORMAL HIGH (ref 8–23)
BUN: 43 mg/dL — ABNORMAL HIGH (ref 8–23)
BUN: 40 mg/dL — ABNORMAL HIGH (ref 8–23)
BUN: 40 mg/dL — ABNORMAL HIGH (ref 8–23)
BUN: 37 mg/dL — ABNORMAL HIGH (ref 8–23)
CO2: 15 mmol/L — ABNORMAL LOW (ref 22–32)
CO2: 21 mmol/L — ABNORMAL LOW (ref 22–32)
CO2: 25 mmol/L (ref 22–32)
CO2: 28 mmol/L (ref 22–32)
CO2: 31 mmol/L (ref 22–32)
Calcium: 9.2 mg/dL (ref 8.9–10.3)
Calcium: 9.5 mg/dL (ref 8.9–10.3)
Calcium: 9.3 mg/dL (ref 8.9–10.3)
Calcium: 9.1 mg/dL (ref 8.9–10.3)
Calcium: 9.1 mg/dL (ref 8.9–10.3)
Chloride: 102 mmol/L (ref 98–111)
Chloride: 103 mmol/L (ref 98–111)
Chloride: 106 mmol/L (ref 98–111)
Chloride: 106 mmol/L (ref 98–111)
Chloride: 105 mmol/L (ref 98–111)
Creatinine, Ser: 2.1 mg/dL — ABNORMAL HIGH (ref 0.61–1.24)
Creatinine, Ser: 1.75 mg/dL — ABNORMAL HIGH (ref 0.61–1.24)
Creatinine, Ser: 1.57 mg/dL — ABNORMAL HIGH (ref 0.61–1.24)
Creatinine, Ser: 1.53 mg/dL — ABNORMAL HIGH (ref 0.61–1.24)
Creatinine, Ser: 1.47 mg/dL — ABNORMAL HIGH (ref 0.61–1.24)
GFR, Estimated: 34 mL/min — ABNORMAL LOW (ref >60)
GFR, Estimated: 43 mL/min — ABNORMAL LOW (ref >60)
GFR, Estimated: 49 mL/min — ABNORMAL LOW (ref >60)
GFR, Estimated: 50 mL/min — ABNORMAL LOW (ref >60)
GFR, Estimated: 53 mL/min — ABNORMAL LOW (ref >60)
Glucose, Bld: 570 mg/dL (ref 70–99)
Glucose, Bld: 274 mg/dL — ABNORMAL HIGH (ref 70–99)
Glucose, Bld: 275 mg/dL — ABNORMAL HIGH (ref 70–99)
Glucose, Bld: 182 mg/dL — ABNORMAL HIGH (ref 70–99)
Glucose, Bld: 138 mg/dL — ABNORMAL HIGH (ref 70–99)
Potassium: 3.8 mmol/L (ref 3.5–5.1)
Potassium: 3.1 mmol/L — ABNORMAL LOW (ref 3.5–5.1)
Potassium: 3.6 mmol/L (ref 3.5–5.1)
Potassium: 3.7 mmol/L (ref 3.5–5.1)
Potassium: 3.4 mmol/L — ABNORMAL LOW (ref 3.5–5.1)
Sodium: 140 mmol/L (ref 135–145)
Sodium: 143 mmol/L (ref 135–145)
Sodium: 143 mmol/L (ref 135–145)
Sodium: 144 mmol/L (ref 135–145)
Sodium: 144 mmol/L (ref 135–145)

## 2022-11-26 LAB — HEPATIC FUNCTION PANEL
ALT: 20 U/L (ref 0–44)
AST: 22 U/L (ref 15–41)
Albumin: 2.7 g/dL — ABNORMAL LOW (ref 3.5–5.0)
Alkaline Phosphatase: 68 U/L (ref 38–126)
Bilirubin, Direct: 0.2 mg/dL (ref 0.0–0.2)
Indirect Bilirubin: 2.1 mg/dL — ABNORMAL HIGH (ref 0.3–0.9)
Total Bilirubin: 2.3 mg/dL — ABNORMAL HIGH (ref 0.3–1.2)
Total Protein: 4.6 g/dL — ABNORMAL LOW (ref 6.5–8.1)

## 2022-11-26 LAB — GLUCOSE, CAPILLARY
Glucose-Capillary: 115 mg/dL — ABNORMAL HIGH (ref 70–99)
Glucose-Capillary: 127 mg/dL — ABNORMAL HIGH (ref 70–99)
Glucose-Capillary: 137 mg/dL — ABNORMAL HIGH (ref 70–99)
Glucose-Capillary: 139 mg/dL — ABNORMAL HIGH (ref 70–99)
Glucose-Capillary: 142 mg/dL — ABNORMAL HIGH (ref 70–99)
Glucose-Capillary: 144 mg/dL — ABNORMAL HIGH (ref 70–99)
Glucose-Capillary: 145 mg/dL — ABNORMAL HIGH (ref 70–99)
Glucose-Capillary: 148 mg/dL — ABNORMAL HIGH (ref 70–99)
Glucose-Capillary: 149 mg/dL — ABNORMAL HIGH (ref 70–99)
Glucose-Capillary: 152 mg/dL — ABNORMAL HIGH (ref 70–99)
Glucose-Capillary: 152 mg/dL — ABNORMAL HIGH (ref 70–99)
Glucose-Capillary: 199 mg/dL — ABNORMAL HIGH (ref 70–99)
Glucose-Capillary: 207 mg/dL — ABNORMAL HIGH (ref 70–99)
Glucose-Capillary: 224 mg/dL — ABNORMAL HIGH (ref 70–99)
Glucose-Capillary: 247 mg/dL — ABNORMAL HIGH (ref 70–99)
Glucose-Capillary: 280 mg/dL — ABNORMAL HIGH (ref 70–99)
Glucose-Capillary: 341 mg/dL — ABNORMAL HIGH (ref 70–99)
Glucose-Capillary: 365 mg/dL — ABNORMAL HIGH (ref 70–99)
Glucose-Capillary: 397 mg/dL — ABNORMAL HIGH (ref 70–99)
Glucose-Capillary: 448 mg/dL — ABNORMAL HIGH (ref 70–99)
Glucose-Capillary: 468 mg/dL — ABNORMAL HIGH (ref 70–99)
Glucose-Capillary: 468 mg/dL — ABNORMAL HIGH (ref 70–99)
Glucose-Capillary: 485 mg/dL — ABNORMAL HIGH (ref 70–99)
Glucose-Capillary: 510 mg/dL (ref 70–99)
Glucose-Capillary: 511 mg/dL (ref 70–99)
Glucose-Capillary: 519 mg/dL (ref 70–99)
Glucose-Capillary: 535 mg/dL (ref 70–99)
Glucose-Capillary: 573 mg/dL (ref 70–99)
Glucose-Capillary: >600 mg/dL (ref 70–99)
Glucose-Capillary: >600 mg/dL (ref 70–99)

## 2022-11-26 LAB — ECHOCARDIOGRAM COMPLETE
AR max vel: 1.67 cm2
AV Area VTI: 1.61 cm2
AV Area mean vel: 1.7 cm2
AV Mean grad: 4 mmHg
AV Peak grad: 8.9 mmHg
Ao pk vel: 1.49 m/s
Area-P 1/2: 3.45 cm2
Height: 70 in
MV VTI: 1.76 cm2
S' Lateral: 2.8 cm
Weight: 2292.78 oz

## 2022-11-26 LAB — BETA-HYDROXYBUTYRIC ACID: Beta-Hydroxybutyric Acid: 3.02 mmol/L — ABNORMAL HIGH (ref 0.05–0.27)

## 2022-11-26 LAB — CBC
HCT: 30.9 % — ABNORMAL LOW (ref 39.0–52.0)
Hemoglobin: 9.8 g/dL — ABNORMAL LOW (ref 13.0–17.0)
MCH: 30.7 pg (ref 26.0–34.0)
MCHC: 31.7 g/dL (ref 30.0–36.0)
MCV: 96.9 fL (ref 80.0–100.0)
Platelets: 169 10*3/uL (ref 150–400)
RBC: 3.19 MIL/uL — ABNORMAL LOW (ref 4.22–5.81)
RDW: 13.5 % (ref 11.5–15.5)
WBC: 9.5 10*3/uL (ref 4.0–10.5)
nRBC: 0 % (ref 0.0–0.2)

## 2022-11-26 LAB — AMYLASE
Amylase: 3030 U/L — ABNORMAL HIGH (ref 28–100)
Amylase: 3279 U/L — ABNORMAL HIGH (ref 28–100)

## 2022-11-26 LAB — OCCULT BLOOD GASTRIC / DUODENUM (SPECIMEN CUP)
Occult Blood, Gastric: POSITIVE — AB
pH, Gastric: 5

## 2022-11-26 LAB — PHOSPHORUS: Phosphorus: 2.6 mg/dL (ref 2.5–4.6)

## 2022-11-26 LAB — LACTIC ACID, PLASMA
Lactic Acid, Venous: 2.5 mmol/L (ref 0.5–1.9)
Lactic Acid, Venous: 2.9 mmol/L (ref 0.5–1.9)
Lactic Acid, Venous: 4.5 mmol/L (ref 0.5–1.9)

## 2022-11-26 LAB — PROCALCITONIN: Procalcitonin: 1.21 ng/mL

## 2022-11-26 LAB — MAGNESIUM: Magnesium: 2.3 mg/dL (ref 1.7–2.4)

## 2022-11-26 LAB — TRIGLYCERIDES: Triglycerides: 284 mg/dL — ABNORMAL HIGH (ref <150)

## 2022-11-26 LAB — HEMOGLOBIN AND HEMATOCRIT, BLOOD
HCT: 34.2 % — ABNORMAL LOW (ref 39.0–52.0)
Hemoglobin: 11.6 g/dL — ABNORMAL LOW (ref 13.0–17.0)

## 2022-11-26 LAB — LIPASE, BLOOD: Lipase: 262 U/L — ABNORMAL HIGH (ref 11–51)

## 2022-11-26 LAB — TROPONIN I (HIGH SENSITIVITY): Troponin I (High Sensitivity): 61 ng/L — ABNORMAL HIGH (ref <18)

## 2022-11-26 LAB — HIV ANTIBODY (ROUTINE TESTING W REFLEX): HIV Screen 4th Generation wRfx: NONREACTIVE

## 2022-11-26 MED ORDER — INSULIN ASPART 100 UNIT/ML IJ SOLN
0.0000 [IU] | INTRAMUSCULAR | Status: DC
  Administered 2022-11-26: 1 [IU] via SUBCUTANEOUS
  Administered 2022-11-27 (×2): 2 [IU] via SUBCUTANEOUS
  Administered 2022-11-27: 3 [IU] via SUBCUTANEOUS
  Administered 2022-11-27: 2 [IU] via SUBCUTANEOUS
  Administered 2022-11-27: 1 [IU] via SUBCUTANEOUS
  Administered 2022-11-27: 5 [IU] via SUBCUTANEOUS
  Administered 2022-11-28 – 2022-11-29 (×5): 2 [IU] via SUBCUTANEOUS
  Administered 2022-11-29 (×2): 5 [IU] via SUBCUTANEOUS
  Administered 2022-11-30: 3 [IU] via SUBCUTANEOUS
  Administered 2022-11-30: 2 [IU] via SUBCUTANEOUS
  Administered 2022-11-30: 1 [IU] via SUBCUTANEOUS
  Filled 2022-11-26 (×18): qty 1

## 2022-11-26 MED ORDER — LACTATED RINGERS IV BOLUS
1000.0000 mL | Freq: Once | INTRAVENOUS | Status: AC
  Administered 2022-11-26: 1000 mL via INTRAVENOUS

## 2022-11-26 MED ORDER — LORAZEPAM 2 MG/ML IJ SOLN
INTRAMUSCULAR | Status: AC
  Administered 2022-11-26: 1 mg
  Filled 2022-11-26: qty 1

## 2022-11-26 MED ORDER — NOREPINEPHRINE 4 MG/250ML-% IV SOLN
2.0000 ug/min | INTRAVENOUS | Status: DC
  Administered 2022-11-26 – 2022-11-27 (×3): 2 ug/min via INTRAVENOUS
  Filled 2022-11-26: qty 250

## 2022-11-26 MED ORDER — PIPERACILLIN-TAZOBACTAM 3.375 G IVPB
3.3750 g | Freq: Three times a day (TID) | INTRAVENOUS | Status: DC
  Administered 2022-11-26 – 2022-11-30 (×11): 3.375 g via INTRAVENOUS
  Filled 2022-11-26 (×12): qty 50

## 2022-11-26 MED ORDER — LACTATED RINGERS IV BOLUS
500.0000 mL | Freq: Once | INTRAVENOUS | Status: AC
  Administered 2022-11-26: 500 mL via INTRAVENOUS

## 2022-11-26 MED ORDER — DEXMEDETOMIDINE HCL IN NACL 400 MCG/100ML IV SOLN
0.0000 ug/kg/h | INTRAVENOUS | Status: DC
  Administered 2022-11-26: 0.4 ug/kg/h via INTRAVENOUS
  Filled 2022-11-26: qty 100

## 2022-11-26 MED ORDER — POTASSIUM CHLORIDE 10 MEQ/100ML IV SOLN
10.0000 meq | INTRAVENOUS | Status: AC
  Administered 2022-11-26 (×2): 10 meq via INTRAVENOUS
  Filled 2022-11-26 (×2): qty 100

## 2022-11-26 MED ORDER — FREE WATER
30.0000 mL | Status: DC
  Administered 2022-11-26 – 2022-11-27 (×5): 30 mL

## 2022-11-26 MED ORDER — INSULIN GLARGINE-YFGN 100 UNIT/ML ~~LOC~~ SOLN
10.0000 [IU] | Freq: Two times a day (BID) | SUBCUTANEOUS | Status: DC
  Administered 2022-11-26 – 2022-11-30 (×6): 10 [IU] via SUBCUTANEOUS
  Filled 2022-11-26 (×9): qty 0.1

## 2022-11-26 MED ORDER — LACTATED RINGERS IV SOLN: INTRAVENOUS | Status: DC

## 2022-11-26 MED ORDER — DEXMEDETOMIDINE HCL IN NACL 400 MCG/100ML IV SOLN
INTRAVENOUS | Status: AC
  Administered 2022-11-26: 0.4 ug/kg/h via INTRAVENOUS
  Filled 2022-11-26: qty 100

## 2022-11-26 MED ORDER — VITAL HIGH PROTEIN PO LIQD: 1000.0000 mL | ORAL | Status: DC

## 2022-11-26 MED ORDER — FENTANYL 2500MCG IN NS 250ML (10MCG/ML) PREMIX INFUSION
INTRAVENOUS | Status: AC
  Filled 2022-11-26: qty 250

## 2022-11-26 MED ORDER — CHLORHEXIDINE GLUCONATE CLOTH 2 % EX PADS
6.0000 | MEDICATED_PAD | Freq: Every day | CUTANEOUS | Status: DC
  Administered 2022-11-26 – 2022-11-30 (×5): 6 via TOPICAL

## 2022-11-26 MED ORDER — PANTOPRAZOLE SODIUM 40 MG IV SOLR
40.0000 mg | Freq: Two times a day (BID) | INTRAVENOUS | Status: DC
  Administered 2022-11-26 – 2022-11-29 (×7): 40 mg via INTRAVENOUS
  Filled 2022-11-26 (×7): qty 10

## 2022-11-26 MED ORDER — SODIUM CHLORIDE 0.9 % IV SOLN
250.0000 mL | INTRAVENOUS | Status: DC
  Administered 2022-11-26 (×3): 250 mL via INTRAVENOUS

## 2022-11-26 MED ORDER — POTASSIUM CHLORIDE 10 MEQ/100ML IV SOLN
10.0000 meq | INTRAVENOUS | Status: AC
  Administered 2022-11-26 (×4): 10 meq via INTRAVENOUS
  Filled 2022-11-26 (×4): qty 100

## 2022-11-26 MED ORDER — FENTANYL 2500MCG IN NS 250ML (10MCG/ML) PREMIX INFUSION
50.0000 ug/h | INTRAVENOUS | Status: DC
  Administered 2022-11-26: 50 ug/h via INTRAVENOUS

## 2022-11-26 MED ORDER — DEXMEDETOMIDINE HCL IN NACL 400 MCG/100ML IV SOLN
0.0000 ug/kg/h | INTRAVENOUS | Status: DC
  Administered 2022-11-26 – 2022-11-27 (×2): 0.4 ug/kg/h via INTRAVENOUS
  Filled 2022-11-26: qty 100

## 2022-11-26 MED ORDER — VITAL 1.5 CAL PO LIQD
1000.0000 mL | ORAL | Status: DC
  Administered 2022-11-26: 1000 mL

## 2022-11-26 NOTE — Progress Notes (Addendum)
Initial Nutrition Assessment  DOCUMENTATION CODES:   Not applicable  INTERVENTION:   -TF via OGT:   Vital 1.5 @ 20 ml/hr   Once medically able, increase by 10 ml every 4 hours to goal rate of 55 ml/hr.   30 ml free water flush every 4 hours to maintain tube patency  Tube feeding regimen provides 1980 kcal, 89 grams of protein, and 1008 ml of H2O. Total free water: 1188 free water daily  NUTRITION DIAGNOSIS:   Inadequate oral intake related to inability to eat as evidenced by NPO status.  GOAL:   Patient will meet greater than or equal to 90% of their needs  MONITOR:   Vent status, TF tolerance  REASON FOR ASSESSMENT:   Ventilator    ASSESSMENT:   Pt admitted for evaluation of altered mental status in the setting of poorly controlled type 2 diabetes mellitus and early onset Alzheimer's.  Pt admitted with severe DKA.   Patient is currently intubated on ventilator support. OGT placement confirmed by x-ray; tip of tube in stomach and side port at GE junction. MV: 7.9 L/min Temp (24hrs), Avg:97.4 F (36.3 C), Min:91.2 F (32.9 C), Max:99.9 F (37.7 C)  Reviewed I/O's: +3.3 L x 24 hours   UOP: 1.1 L x 24 hours  Case discussed with RN, MD, and during ICU rounds. Pt responds to physical stimuli and not on continuous sedation. Pt with high CBGS (over 800) on admission and became agitated and combative. He is currently on insulin drip with OGT connected to suction. Per RN, gastric occult positive with concern for gastritis. Per NP, concern for gallstone pancreatitis. Received permission to start trickle feeds secondary to concern for pancreatitis.   Spoke with pt wife at bedside. She reports pt with decreased appetite over the past week, as pt complained of feeling generally unwell. Pt usually consumes 3 meals per day (Breakfast: oatmeal, Late Morning: breakfast bowl; Dinner: meat, starch, and vegetable). Pt drinks diet soda and zero calorie powerade throughout the day. Pt  complained of increased thirst PTA.   Per pt wife, pt weighed 180# several years ago prior to DM diagnosis and now weighs around 150#; wife suspects wt loss was related to positive lifestyle changes to assist with management of DM. She estimates pt has lost about 3-4 pounds over the past 6-12 months. Reviewed wt hx; noted wt stability since 2019.   Per discussion with pt wife, pt with increased difficulty with medication management over the past 6 months secondary to dementia diagnosis. Wife sees improved control on the weekends and evenings, as she is there to help monitor the pt. She helps pt refill his V-go and also has access to his glucometer and [employer]. She shares pt is fairly consistent with his bolus insulin (as he has it on hand via his V-go), but often forgets to take his basal insulin. Per wife, pt often needs prompting to remember to take his medications and suspects he often forgets doses when she is not present with him.   Medications reviewed and include colace, protonix, miralax, dextrose 5% in lactated ringers infusion @ 120 ml/hr, and levophed.   Lab Results  Component Value Date   HGBA1C 12.8 (H) 11/25/2022   PTA DM medications are 1000 mg metformin BID, 76 units insulin aspart daily (via V-go), and 12 units insulin degludec daily.   Labs reviewed: CBGS: 149-207 (inpatient orders for glycemic control are IV insulin drip).    NUTRITION - FOCUSED PHYSICAL EXAM:  Flowsheet Row  Most Recent Value  Orbital Region No depletion  Upper Arm Region No depletion  Thoracic and Lumbar Region No depletion  Buccal Region No depletion  Temple Region No depletion  Clavicle Bone Region No depletion  Clavicle and Acromion Bone Region No depletion  Scapular Bone Region No depletion  Dorsal Hand No depletion  Patellar Region No depletion  Anterior Thigh Region No depletion  Posterior Calf Region No depletion  Edema (RD Assessment) None  Hair Reviewed  Eyes Reviewed  Mouth  Reviewed  Skin Reviewed  Nails Reviewed       Diet Order:   Diet Order             Diet NPO time specified  Diet effective now                   EDUCATION NEEDS:   Education needs have been addressed  Skin:  Skin Assessment: Reviewed RN Assessment  Last BM:  Unknown  Height:   Ht Readings from Last 1 Encounters:  11/25/22 5\' 10"  (1.778 m)    Weight:   Wt Readings from Last 1 Encounters:  11/25/22 65 kg    Ideal Body Weight:  75.5 kg  BMI:  Body mass index is 20.56 kg/m.  Estimated Nutritional Needs:   Kcal:     Protein:     Fluid:       Loistine Chance, RD, LDN, [place] Registered Dietitian II Certified Diabetes Care and Education Specialist Please refer to [facility] for RD and/or RD on-call/weekend/after hours pager

## 2022-11-26 NOTE — Consult Note (Addendum)
PHARMACY CONSULT NOTE - FOLLOW UP  Pharmacy Consult for Electrolyte Monitoring and Replacement   Recent Labs: Potassium (mmol/L)  Date Value  11/26/2022 3.4 (L)  12/14/2011 4.0   Magnesium (mg/dL)  Date Value  11/26/2022 2.3   Calcium (mg/dL)  Date Value  11/26/2022 9.1   Calcium, Total (mg/dL)  Date Value  12/14/2011 9.4   Albumin (g/dL)  Date Value  11/26/2022 2.7 (L)  12/14/2011 4.3   Phosphorus (mg/dL)  Date Value  11/26/2022 2.6   Sodium (mmol/L)  Date Value  11/26/2022 144  12/14/2011 136     Assessment: 67 year old male admitted with DKA on insulin infusion. Pharmacy consulted for repletion and monitoring of electrolytes.  Goal of Therapy:  Electrolytes WNL  Plan:  K 3.4. Patient currently receiving potassium rider. Recheck in 4 hours. Continue to monitor potassium q4h while on insulin infusion  Darrick Penna ,PharmD Clinical Pharmacist 11/26/2022 8:46 PM

## 2022-11-26 NOTE — Progress Notes (Signed)
NAME:  Alan Nunez, MRN:  [id number], DOB:  10-Oct-1956, LOS: 1 ADMISSION DATE:  11/25/2022, CONSULTATION DATE:  11/25/22 REFERRING MD:  Dr. Scotty Court, CHIEF COMPLAINT:  AMS   Brief Pt Description / Synopsis:  67 y.o. male admitted with Acute metabolic Encephalopathy in the setting of Sever DKA due to Acute Pancreatitis, Ascending colitis, and aspiration requiring intubation for airway protection.   History of Present Illness:  67 year old male presenting to [facility] ED from home via EMS for evaluation of altered mental status in the setting of poorly controlled type 2 diabetes mellitus and early onset Alzheimer's.  History provided bedside by spouse. She reports the patient has been feeling " unwell" and fatigued without any specific complaints for the last couple of days.  He has not been interested in eating or drinking normally, she believes he might not have had any nutrition over the last 2 days.  She arrived home to find him extremely altered and called EMS. She does admit she suspects he is inconsistent with his insulin regimen during the week while she is at work.  At his baseline the patient is able to perform all his own ADLs, but has become increasingly forgetful asking questions multiple times.  While he reports to her he is taking his medication, she has noticed that his blood glucose runs high during the week and comes down on the weekends when she is giving him his injections.  She reported 1 possible vomiting episode that EMS noted in the trash can while she was at work today on 11/25/2022.  She denies any complaints from him about fever/chills, nausea/diarrhea/abdominal pain, shortness of breath/productive cough, chest pain, or urinary symptoms.  She denies any history of tobacco use, ETOH or recreational drug use.  ED course: Upon arrival the patient was stuporous but also somewhat agitated and combative.  Workup appears consistent with DKA and dehydration.  Lab work significant for  hyperkalemia, AKI, anion gap metabolic acidosis, severe hyperglycemia at 863, pseudo mild hyponatremia, elevated total bilirubin, elevated osmolality, leukocytosis and lactic acidosis, with a beta hydroxy greater than 8.  DKA protocol started, shifting measures given for hyperkalemia, and low-dose sedatives given for combative agitation. Medications given: calcium gluconate 1g, benadryl, haldol, ativan, insulin 10 units, sodium bicarb 50 meq, insulin drip started, 2 L IV fluid. Initial Vitals: 22, 108, 171/93 & 100% on RA Significant labs: (Labs/ Imaging personally reviewed) I, Cheryll Cockayne Rust-Chester, AGACNP-BC, personally viewed and interpreted this ECG. EKG Interpretation: Date: 11/25/2022, EKG Time: 16:37, Rate: 104, Rhythm: Sinus tachycardia, QRS Axis: RAD, Intervals: RBBB/LAFB, mildly prolonged QTc, ST/T Wave abnormalities: Nonspecific T wave inversions, peaked T waves, Narrative Interpretation: Sinus tachycardia with RBBB/LAFB, peaked T waves and nonspecific T wave inversions  Chemistry: Na+:132 > 134, K+: 6.7 > 5.8, BUN/Cr.: 55/2.36, Serum CO2/ AG: <7/not calc, Cl: 94, Phos: 9.0, Mg: 2.9, glucose: 863, T. Bili: 2.4 Hematology: WBC: 23, Hgb: 11.8,  Troponin: 55, Lactic/ PCT: 6.0/ 0.29, COVID-19 & Influenza A/B: negative CK: 149, osmolality: 368, lipase: 285  HA1C: 12.8, Beta-hydroxy: >8 VBG: <6.95/ 23/59/4.8  CXR 11/25/2022: some linear opacity of the left lung base considered to be mild left basilar atelectasis. CT head without contrast 11/25/2022: No acute intracranial findings with stable mild atrophy and small vessel change.  Partial opacification of the left ethmoid air cells with increased fluid in the lower mastoid air cells bilaterally.   CT abdomen and pelvis without contrast 11/25/2022: bilateral left greater than right lower lobe airspace consolidation with air bronchograms  most likely due to pneumonia or aspiration.  Calcification in the aortic valve leaflets.  Acute interstitial  pancreatitis with minimal nonlocalizing fluid around the head of the pancreas extending to the pancreaticoduodenal groove.  No peripancreatic abscess or hemorrhage.  Cholelithiasis without evidence of cholecystitis.  Nonobstructive nephro lithiasis.  Nonspecific enteritis/enteropathy with thickened folds in the left abdominal small bowel and in the pelvic small bowel and mesenteric inflammatory reaction in the left abdomen.  Ascending colitis versus non distention, trace pelvic ascites, diverticulosis without evidence of diverticulitis, prostate a megaly  PCCM consulted for admission due to severe DKA with severe anion gap metabolic acidosis requiring closer monitoring and sodium bicarbonate supplementation.  Upon arrival to ED room patient was unable to follow commands but agitated thrashing in the bed requiring nursing to hold his arms while trying to get lab work.  During my conversation with his spouse the patient became increasingly somnolent eventually a RASS of -5.  Due to concerns for airway protection patient was emergently intubated requiring mechanical ventilatory support.  Pertinent  Medical History  T2DM HTN ED GERD H. Pylori (2010) Mitral valve disorder Chronic pain of the cervical spine Early onset alzheimers OSA?  Micro Data:  1/25: SARS-CoV-2/RSV/Flu PCR >> negative 1/25: HIV screen>> nonreactive 1/25: MRSA PCR>>negative 1/26: Blood culture x2>> 1/26: Tracheal aspirate>>  Antimicrobials:  Unasyn 1/25>>1/26 Zosyn 1/26>>  Significant Hospital Events: Including procedures, antibiotic start and stop dates in addition to other pertinent events   11/25/2022 : Admit to ICU with severe DKA with severe anion gap metabolic acidosis and coma requiring intubation and mechanical ventilatory support. 11/26/2022: Remains on vent and low dose Levophed.  DKA/AG metabolic acidosis/AKI improving.  Interim History / Subjective:  -Pt admitted last night, required intubation for airway  protection -Low grade temperature, requiring low dose pressors (3 mcg Levo) -On minimal vent settings -Remains encephalopathic, moving extremities but not to commands currently -DKA, AG metabolic acidosis, and AKI slowly improving ~ follow up BMP is pending -Concern for aspiration on CT, along with gallstones (no cholecystitis or dilation of ducts) ~ will change ABX to Zosyn to give abdominal coverage for now given critical illness   Objective   Blood pressure (!) 106/51, pulse 78, temperature 99.9 F (37.7 C), resp. rate 14, height 5\' 10"  (1.778 m), weight 65 kg, SpO2 100 %.    Vent Mode: PRVC FiO2 (%):  [30 %] 30 % Set Rate:  [14 bmp-18 bmp] 14 bmp Vt Set:  [500 mL-570 mL] 570 mL PEEP:  [5 cmH20] 5 cmH20   Intake/Output Summary (Last 24 hours) at 11/26/2022 11/28/2022 Last data filed at 11/26/2022 0800 Gross per 24 hour  Intake 5148.32 ml  Output 1965 ml  Net 3183.32 ml    Filed Weights   11/25/22 1758  Weight: 65 kg    Examination: General: Adult male, critically ill, lying in bed intubated & sedated requiring mechanical ventilation, NAD HEENT: MM pink/dry, anicteric, atraumatic, neck supple, orally intubated Neuro: Lightly sedated, arouses to gentle stimulation, moves extremities, but currently not following commands, pupils PERRL CV: s1s2 RRR, ST on monitor, no r/m/g Pulm: Mechanical breath sounds throughout, occasionally overbreathes the vent, even GI: soft, flat, non tender, bs x 4 GU: foley in place  with clear yellow urine Skin: no rashes/lesions noted Extremities: warm/dry, pulses + 2 R/P, no edema noted  Resolved Hospital Problem list     Assessment & Plan:   #Severe diabetic ketoacidosis  PMHx: Poorly controlled T2DM Patient has a V-go device on.  Discussed with wife she believes this device is from 11/24/2022, but it does appear as though it has been on the skin for quite a while.  I can see liquid inside the cartridge as well.  His wife confirmed this device  should be giving him long-acting insulin continuously and then you need to manually click the device to give SSI with meals.  She reports he is not good at remembering to take his SSI coverage, however she also admits that device previously had malfunctioned which might be possible in this case. -Follow DKA protocol -IV fluids, when blood glucose <250, add D5 to IV fluids -Insulin gtt -Follow BMP q4h -Once AG closed and serum Bicarb >20, will start SSI & basal insulin (likely restart home insulin pump prior to discharge) -Diabetes Coordinator consulted, appreciate input   #Acute Respiratory Failure secondary to acute encephalopathy with aspiration in the setting of DKA and sedative administration, along with multiple metabolic derangements #Suspect aspiration pneumonia -Full vent support, implement lung protective strategies -Plateau pressures less than 30 cm H20 -Wean FiO2 & PEEP as tolerated to maintain O2 sats >92% -Follow intermittent Chest X-ray & ABG as needed -Spontaneous Breathing Trials when respiratory parameters met and mental status permits -Implement VAP Bundle -Prn Bronchodilators -ABX as above  #Severe Sepsis in setting of Aspiration, Acute Pancreatitis, & Ascending colitis -Monitor fever curve -Trend WBC's & Procalcitonin -Follow cultures as above -Broaden to empiric Zosyn pending cultures & sensitivities  #Shock: Hypovolemic +/- Septic #Small Pericardial Effusion & calcification on aortic leaflets noted on CT imaging RBBB noted on EKG #Mildly elevated troponin secondary to NSTEMI versus demand ischemia -Continuous cardiac monitoring -Maintain MAP >65 -IV fluids -Vasopressors as needed to maintain MAP goal -Trend lactic acid until normalized -Trend HS Troponin until peaked -Echocardiogram pending -Consider cardiology consult depending on echocardiogram results -Outpatient antihypertensives on hold given continuous sedatives  #Acute interstitial pancreatitis  without necrotizing features or abscess, suspect due to cholelithiasis CT Abdomen & Pelvis concerning for acute pancreatitis, no peripancreatic abscess or hemorrhage; cholelithiasis without evidence of cholecystitis, NO biliary dilation - Trend pancreatic enzymes & hepatic function - NPO, will start trickle TF  - continue aggressive IVF resuscitation & pain control - f/u lipase, amylase, triglycerides  #Acute Kidney Injury secondary to hypovolemia and dehydration in the setting of DKA ~ IMPROVING #Severe Anion Gap Metabolic Acidosis ~ IMPROVING #Hyperkalemia ~ RESOLVED #Pseudo-hyponatremia secondary to hyperglycemia ~ RESOLVED PMHx: Kidney stones Baseline Cr: 1.09, Cr on admission: 2.36 -Monitor I&O's / urinary output -Follow BMP -Ensure adequate renal perfusion -Avoid nephrotoxic agents as able -Replace electrolytes as indicated~ Pharmacy following for assistance -IV fluids -Will stop bicarb gtt 1/26  #Query GIB Emesis Dark- almost coffee ground -PPI BID -gastric occult ordered -Monitor for S/Sx of bleeding -Trend CBC -SCD's for VTE Prophylaxis  -Transfuse for Hgb <7 -Consider GI consult  #Acute encephalopathy suspect metabolic #Early onset Alzheimer's #Sedation needs in setting of mechanical ventilation At reported baseline the patient is independent of ADLs but forgetful -CT Head negative; UDS negative -Treatment of DKA and multiple metabolic derangements as outlined above -Maintain a RASS goal of 0 to -1 -Prn Fentanyl and versed pushes to maintain RASS goal -Avoid sedating medications as able -Daily wake up assessment -Continue Aricept    Pt is critically ill, prognosis is guarded.  High risk for cardiac arrest and death.   Best Practice (right click and "Reselect all SmartList Selections" daily)  Diet/type: NPO, start trickle feeds DVT prophylaxis: prophylactic heparin  GI prophylaxis: PPI  Lines: N/A Foley:  Yes, and it is still needed Code Status:  full  code Last date of multidisciplinary goals of care discussion [11/26/2022]  1/26: Update pt's wife Renee at bedside.  Labs   CBC: Recent Labs  Lab 11/25/22 1700 11/26/22 0304  WBC 23.0* 9.5  NEUTROABS 17.1*  --   HGB 11.8* 9.8*  HCT 41.1 30.9*  MCV 106.5* 96.9  PLT 315 169     Basic Metabolic Panel: Recent Labs  Lab 11/25/22 1700 11/25/22 1931 11/26/22 0304  NA 132* 134* 140  K 6.7* 5.8* 3.8  CL 94* 97* 102  CO2 <7* <7* 15*  GLUCOSE 863* 862* 570*  BUN 55* 52* 49*  CREATININE 2.36* 2.32* 2.10*  CALCIUM 9.8 9.7 9.2  MG 2.9*  --  2.3  PHOS 9.0*  --  2.6    GFR: Estimated Creatinine Clearance: 32.2 mL/min (A) (by C-G formula based on SCr of 2.1 mg/dL (H)). Recent Labs  Lab 11/25/22 1700 11/25/22 1931 11/26/22 0004 11/26/22 0304  PROCALCITON 0.29  --   --  1.21  WBC 23.0*  --   --  9.5  LATICACIDVEN  --  6.0* 2.5* 2.9*     Liver Function Tests: Recent Labs  Lab 11/25/22 1700 11/26/22 0259  AST 25 22  ALT 21 20  ALKPHOS 94 68  BILITOT 2.4* 2.3*  PROT 6.2* 4.6*  ALBUMIN 3.7 2.7*    Recent Labs  Lab 11/25/22 1931 11/26/22 0259 11/26/22 0304  LIPASE 285* 262*  --   AMYLASE  --  3,279* 3,030*   No results for input(s): "AMMONIA" in the last 168 hours.  ABG    Component Value Date/Time   PHART 7.06 (LL) 11/25/2022 2135   PCO2ART 35 11/25/2022 2135   PO2ART 82 (L) 11/25/2022 2135   HCO3 10.6 (L) 11/25/2022 2135   ACIDBASEDEF 20.1 (H) 11/25/2022 2135   O2SAT 100 11/25/2022 2135     Coagulation Profile: No results for input(s): "INR", "PROTIME" in the last 168 hours.  Cardiac Enzymes: Recent Labs  Lab 11/25/22 1931  CKTOTAL 149    HbA1C: Hgb A1c MFr Bld  Date/Time Value Ref Range Status  11/25/2022 05:00 PM 12.8 (H) 4.8 - 5.6 % Final    Comment:    (NOTE) Pre diabetes:          5.7%-6.4%  Diabetes:              >6.4%  Glycemic control for   <7.0% adults with diabetes     CBG: Recent Labs  Lab 11/26/22 0506  11/26/22 0534 11/26/22 0617 11/26/22 0709 11/26/22 0812  GLUCAP 448* 397* 365* 341* 280*     Review of Systems:   UTA- intubated/sedated/AMS  Past Medical History:  He,  has a past medical history of Diabetes mellitus without complication (HCC) and Hypertension.   Surgical History:   Past Surgical History:  Procedure Laterality Date   HERNIA REPAIR       Social History:   reports that he has never smoked. He has never used smokeless tobacco. He reports that he does not currently use alcohol. He reports that he does not currently use drugs.   Family History:  His family history is not on file.   Allergies Allergies  Allergen Reactions   Codeine Hives   Morphine And Related Hives     Home Medications  Prior to Admission medications   Medication Sig Start Date End Date Taking? Authorizing Provider  insulin aspart (NOVOLOG) 100  UNIT/ML injection Inject 76 Units into the skin daily. In V-Go delivery system 11/27/21  Yes [provider]  insulin degludec (TRESIBA) 100 UNIT/ML FlexTouch Pen Inject 12 Units into the skin at bedtime. 08/24/22  Yes [provider]  donepezil (ARICEPT) 5 MG tablet Take 5 mg by mouth daily.    [provider]  HYDROcodone-acetaminophen (NORCO) 5-325 MG tablet Take 1 tablet by mouth every 6 (six) hours as needed for up to 7 doses for severe pain. 02/25/18   Darel Hong, MD  ibuprofen (ADVIL,MOTRIN) 600 MG tablet Take 1 tablet (600 mg total) by mouth every 8 (eight) hours as needed. 02/25/18   Darel Hong, MD  metFORMIN (GLUCOPHAGE) 1000 MG tablet Take 1,000 mg by mouth 2 (two) times daily.    [provider]  tamsulosin (FLOMAX) 0.4 MG CAPS capsule Take 1 capsule (0.4 mg total) by mouth daily. 02/25/18   Darel Hong, MD     Critical care time: 40 minutes     Darel Hong, AGACNP-BC [facility] Prefer epic messenger for cross cover needs If after hours, please call E-link

## 2022-11-26 NOTE — Progress Notes (Signed)
Attempted X2 for Sputum specimen, unsuccessful for sample.

## 2022-11-26 NOTE — Plan of Care (Signed)
Problem: Education: Goal: Ability to describe self-care measures that may prevent or decrease complications (Diabetes Survival Skills Education) will improve Outcome: Not Progressing Goal: Individualized Educational Video(s) Outcome: Not Progressing   Problem: Coping: Goal: Ability to adjust to condition or change in health will improve Outcome: Not Progressing   Problem: Fluid Volume: Goal: Ability to maintain a balanced intake and output will improve Outcome: Not Progressing   Problem: Health Behavior/Discharge Planning: Goal: Ability to identify and utilize available resources and services will improve Outcome: Not Progressing Goal: Ability to manage health-related needs will improve Outcome: Not Progressing   Problem: Metabolic: Goal: Ability to maintain appropriate glucose levels will improve Outcome: Not Progressing   Problem: Nutritional: Goal: Maintenance of adequate nutrition will improve Outcome: Not Progressing Goal: Progress toward achieving an optimal weight will improve Outcome: Not Progressing   Problem: Skin Integrity: Goal: Risk for impaired skin integrity will decrease Outcome: Not Progressing   Problem: Tissue Perfusion: Goal: Adequacy of tissue perfusion will improve Outcome: Not Progressing   Problem: Education: Goal: Ability to describe self-care measures that may prevent or decrease complications (Diabetes Survival Skills Education) will improve Outcome: Not Progressing Goal: Individualized Educational Video(s) Outcome: Not Progressing   Problem: Cardiac: Goal: Ability to maintain an adequate cardiac output will improve Outcome: Not Progressing   Problem: Health Behavior/Discharge Planning: Goal: Ability to identify and utilize available resources and services will improve Outcome: Not Progressing Goal: Ability to manage health-related needs will improve Outcome: Not Progressing   Problem: Fluid Volume: Goal: Ability to achieve a  balanced intake and output will improve Outcome: Not Progressing   Problem: Metabolic: Goal: Ability to maintain appropriate glucose levels will improve Outcome: Not Progressing   Problem: Nutritional: Goal: Maintenance of adequate nutrition will improve Outcome: Not Progressing Goal: Maintenance of adequate weight for body size and type will improve Outcome: Not Progressing   Problem: Respiratory: Goal: Will regain and/or maintain adequate ventilation Outcome: Not Progressing   Problem: Urinary Elimination: Goal: Ability to achieve and maintain adequate renal perfusion and functioning will improve Outcome: Not Progressing   Problem: Activity: Goal: Ability to tolerate increased activity will improve Outcome: Not Progressing   Problem: Respiratory: Goal: Ability to maintain a clear airway and adequate ventilation will improve Outcome: Not Progressing   Problem: Role Relationship: Goal: Method of communication will improve Outcome: Not Progressing   Problem: Education: Goal: Knowledge of General Education information will improve Description: Including pain rating scale, medication(s)/side effects and non-pharmacologic comfort measures Outcome: Not Progressing   Problem: Health Behavior/Discharge Planning: Goal: Ability to manage health-related needs will improve Outcome: Not Progressing   Problem: Clinical Measurements: Goal: Ability to maintain clinical measurements within normal limits will improve Outcome: Not Progressing Goal: Will remain free from infection Outcome: Not Progressing Goal: Diagnostic test results will improve Outcome: Not Progressing Goal: Respiratory complications will improve Outcome: Not Progressing Goal: Cardiovascular complication will be avoided Outcome: Not Progressing   Problem: Activity: Goal: Risk for activity intolerance will decrease Outcome: Not Progressing   Problem: Nutrition: Goal: Adequate nutrition will be  maintained Outcome: Not Progressing   Problem: Coping: Goal: Level of anxiety will decrease Outcome: Not Progressing   Problem: Elimination: Goal: Will not experience complications related to bowel motility Outcome: Not Progressing Goal: Will not experience complications related to urinary retention Outcome: Not Progressing   Problem: Pain Managment: Goal: General experience of comfort will improve Outcome: Not Progressing   Problem: Safety: Goal: Ability to remain free from injury will improve Outcome: Not Progressing  Problem: Skin Integrity: Goal: Risk for impaired skin integrity will decrease Outcome: Not Progressing  Patient on vent not following commands sedation needed for safety insulin gtt due to stop and sliding scale will start pressors still on

## 2022-11-26 NOTE — Consult Note (Incomplete)
PHARMACY CONSULT NOTE - FOLLOW UP  Pharmacy Consult for Electrolyte Monitoring and Replacement   Recent Labs: Potassium (mmol/L)  Date Value  11/26/2022 3.8  12/14/2011 4.0   Magnesium (mg/dL)  Date Value  11/26/2022 2.3   Calcium (mg/dL)  Date Value  11/26/2022 9.2   Calcium, Total (mg/dL)  Date Value  12/14/2011 9.4   Albumin (g/dL)  Date Value  11/26/2022 2.7 (L)  12/14/2011 4.3   Phosphorus (mg/dL)  Date Value  11/26/2022 2.6   Sodium (mmol/L)  Date Value  11/26/2022 140  12/14/2011 136     Assessment: 67 y.o. male admitted with Acute metabolic Encephalopathy in the setting of severe DKA due to Acute Pancreatitis, Ascending colitis, and aspiration requiring intubation for airway protection. Pharmacy has been consulted to initiate and replace electrolytes while under PCCM care.  Goal of Therapy:  Electrolytes WNL  Plan:  - no replacement currently indicated - q4h BMP checks ordered while on insulin gtt - next check@1600  - will recheck all electrolytes with AM labs  Pearla Dubonnet ,PharmD Clinical Pharmacist 11/26/2022 7:49 AM

## 2022-11-26 NOTE — Consult Note (Signed)
PHARMACY CONSULT NOTE - FOLLOW UP  Pharmacy Consult for Electrolyte Monitoring and Replacement   Recent Labs: Potassium (mmol/L)  Date Value  11/26/2022 3.7  12/14/2011 4.0   Magnesium (mg/dL)  Date Value  11/26/2022 2.3   Calcium (mg/dL)  Date Value  11/26/2022 9.1   Calcium, Total (mg/dL)  Date Value  12/14/2011 9.4   Albumin (g/dL)  Date Value  11/26/2022 2.7 (L)  12/14/2011 4.3   Phosphorus (mg/dL)  Date Value  11/26/2022 2.6   Sodium (mmol/L)  Date Value  11/26/2022 144  12/14/2011 136     Assessment: 67 year old male admitted with DKA on insulin infusion. Pharmacy consulted for repletion and monitoring of electrolytes.  Goal of Therapy:  Electrolytes WNL  Plan:  K wnl. No repletion requireed at this time Continue to monitor potassium q4h while on insulin infusion  Darrick Penna ,PharmD Clinical Pharmacist 11/26/2022 4:28 PM

## 2022-11-26 NOTE — Progress Notes (Signed)
Patient increasingly more restless and agitated throughout shift. PRN medications administered with minimal affect. MD made aware of increasing use, order received for precedex infusion. Upon initiation of precedex patient became bradycardic and hypotensive. Precedex turned off. Order received for fentanyl infusion.

## 2022-11-26 NOTE — Progress Notes (Signed)
*  PRELIMINARY RESULTS* Echocardiogram 2D Echocardiogram has been performed.  Alan Nunez 11/26/2022, 3:18 PM

## 2022-11-26 NOTE — Progress Notes (Addendum)
1910 patient on vent very agitated not following any commands unable to make needs known on vent 1945 patient agitation increased patient pushing self up side rale on bed concerns that patient will fall out of the bed npt following command patient started back on sedation for safety  2100 patient restless with any stimulation from -3 to 2  11/27/22  0415 patient combative with trying to get legs in bed and trying to hit staff eyes wide open kicking not following commands PRN medication given again for patient and staff safety

## 2022-11-26 NOTE — Progress Notes (Signed)
An USGPIV (ultrasound guided PIV) has been placed for short-term vasopressor infusion. A correctly placed ivWatch must be used when administering Vasopressors. Should this treatment be needed beyond 72 hours, central line access should be obtained.  It will be the responsibility of the bedside nurse to follow best practice to prevent extravasations.

## 2022-11-26 NOTE — Inpatient Diabetes Management (Signed)
Inpatient Diabetes Program Recommendations  AACE/ADA: New Consensus Statement on Inpatient Glycemic Control (2015)  Target Ranges:  Prepandial:   less than 140 mg/dL      Peak postprandial:   less than 180 mg/dL (1-2 hours)      Critically ill patients:  140 - 180 mg/dL   Lab Results  Component Value Date   GLUCAP 247 (H) 11/26/2022   HGBA1C 12.8 (H) 11/25/2022    Latest Reference Range & Units 11/25/22 17:00  Sodium 135 - 145 mmol/L 132 (L) (C)  Potassium 3.5 - 5.1 mmol/L 6.7 (HH) (C)  Chloride 98 - 111 mmol/L 94 (L) (C)  CO2 22 - 32 mmol/L <7 (L) (C)  Glucose 70 - 99 mg/dL 863 (HH) (C)  Mean Plasma Glucose mg/dL 320.66  BUN 8 - 23 mg/dL 55 (H) (C)  Creatinine 0.61 - 1.24 mg/dL 2.36 (H) (C)  Calcium 8.9 - 10.3 mg/dL 9.8 (C)  Anion gap 5 - 15  NOT CALCULATED  Phosphorus 2.5 - 4.6 mg/dL 9.0 (H)  Magnesium 1.7 - 2.4 mg/dL 2.9 (H)  Alkaline Phosphatase 38 - 126 U/L 94 (C)  Albumin 3.5 - 5.0 g/dL 3.7 (C)  AST 15 - 41 U/L 25 (C)  ALT 0 - 44 U/L 21 (C)  Total Protein 6.5 - 8.1 g/dL 6.2 (L) (C)  Total Bilirubin 0.3 - 1.2 mg/dL 2.4 (H) (C)  GFR, Estimated >60 mL/min 30 (L) (C)  (HH): Data is critically high (L): Data is abnormally low (H): Data is abnormally high (C): Corrected  Latest Reference Range & Units 11/26/22 03:04  Sodium 135 - 145 mmol/L 140  Potassium 3.5 - 5.1 mmol/L 3.8  Chloride 98 - 111 mmol/L 102  CO2 22 - 32 mmol/L 15 (L)  Glucose 70 - 99 mg/dL 570 (HH)  BUN 8 - 23 mg/dL 49 (H)  Creatinine 0.61 - 1.24 mg/dL 2.10 (H)  Calcium 8.9 - 10.3 mg/dL 9.2  Anion gap 5 - 15  23 (H)  Phosphorus 2.5 - 4.6 mg/dL 2.6  Magnesium 1.7 - 2.4 mg/dL 2.3  [facility]): Data is critically high (L): Data is abnormally low (H): Data is abnormally high  Diabetes history: DM2 Outpatient Diabetes medications: Tresiba 12 units qd, VGO 40 patch insulin pump (administers 40 units basal over 24 hrs. + meal coverage =2 units per click on pump) Current orders for Inpatient glycemic control: IV  insulin  Inpatient Diabetes Program Recommendations:   Patient sees Dr. Honor Junes for endocrinology with last office visit 08/24/22. A1c on this visit was 10.9 and is currently increased to 12.8.  Meal coverage instructions per Dr. Honor Junes on 08/24/22: "Use the V-go for high sugars at meal time. If sugar under 100 before the meal, take 3 clicks If 124-580, takes 4 clicks If 998-338, takes 5 clicks If 250-539 takes 6 clicks If 767-341, takes 7 clicks If over 937, takes 8 clicks  **For the most part, only take clicks before meals. If you are over 300 at bedtime, you can take 2 clicks to try to bring you down by the next morning. "  Patient's lab indicate patient's need to continue on IV insulin. Will follow patient while inpatient.  Thank you, Nani Gasser. Cristo Ausburn, RN, MSN, CDE  Diabetes Coordinator Inpatient Glycemic Control Team Team Pager [phone] (8am-5pm) 11/26/2022 9:22 AM

## 2022-11-27 DIAGNOSIS — E1111 Type 2 diabetes mellitus with ketoacidosis with coma: Secondary | ICD-10-CM

## 2022-11-27 LAB — GLUCOSE, CAPILLARY
Glucose-Capillary: 103 mg/dL — ABNORMAL HIGH (ref 70–99)
Glucose-Capillary: 134 mg/dL — ABNORMAL HIGH (ref 70–99)
Glucose-Capillary: 152 mg/dL — ABNORMAL HIGH (ref 70–99)
Glucose-Capillary: 174 mg/dL — ABNORMAL HIGH (ref 70–99)
Glucose-Capillary: 182 mg/dL — ABNORMAL HIGH (ref 70–99)
Glucose-Capillary: 219 mg/dL — ABNORMAL HIGH (ref 70–99)
Glucose-Capillary: 264 mg/dL — ABNORMAL HIGH (ref 70–99)

## 2022-11-27 LAB — CBC
HCT: 28.5 % — ABNORMAL LOW (ref 39.0–52.0)
Hemoglobin: 9.7 g/dL — ABNORMAL LOW (ref 13.0–17.0)
MCH: 30.6 pg (ref 26.0–34.0)
MCHC: 34 g/dL (ref 30.0–36.0)
MCV: 89.9 fL (ref 80.0–100.0)
Platelets: 135 10*3/uL — ABNORMAL LOW (ref 150–400)
RBC: 3.17 MIL/uL — ABNORMAL LOW (ref 4.22–5.81)
RDW: 13.5 % (ref 11.5–15.5)
WBC: 6.2 10*3/uL (ref 4.0–10.5)
nRBC: 0 % (ref 0.0–0.2)

## 2022-11-27 LAB — BASIC METABOLIC PANEL
Anion gap: 7 (ref 5–15)
Anion gap: 8 (ref 5–15)
BUN: 32 mg/dL — ABNORMAL HIGH (ref 8–23)
BUN: 35 mg/dL — ABNORMAL HIGH (ref 8–23)
CO2: 28 mmol/L (ref 22–32)
CO2: 29 mmol/L (ref 22–32)
Calcium: 8.6 mg/dL — ABNORMAL LOW (ref 8.9–10.3)
Calcium: 8.7 mg/dL — ABNORMAL LOW (ref 8.9–10.3)
Chloride: 105 mmol/L (ref 98–111)
Chloride: 109 mmol/L (ref 98–111)
Creatinine, Ser: 1.41 mg/dL — ABNORMAL HIGH (ref 0.61–1.24)
Creatinine, Ser: 1.42 mg/dL — ABNORMAL HIGH (ref 0.61–1.24)
GFR, Estimated: 55 mL/min — ABNORMAL LOW (ref >60)
GFR, Estimated: 55 mL/min — ABNORMAL LOW (ref >60)
Glucose, Bld: 129 mg/dL — ABNORMAL HIGH (ref 70–99)
Glucose, Bld: 141 mg/dL — ABNORMAL HIGH (ref 70–99)
Potassium: 3.3 mmol/L — ABNORMAL LOW (ref 3.5–5.1)
Potassium: 3.9 mmol/L (ref 3.5–5.1)
Sodium: 142 mmol/L (ref 135–145)
Sodium: 144 mmol/L (ref 135–145)

## 2022-11-27 LAB — HEPATIC FUNCTION PANEL
ALT: 16 U/L (ref 0–44)
AST: 28 U/L (ref 15–41)
Albumin: 2.2 g/dL — ABNORMAL LOW (ref 3.5–5.0)
Alkaline Phosphatase: 59 U/L (ref 38–126)
Bilirubin, Direct: 0.1 mg/dL (ref 0.0–0.2)
Indirect Bilirubin: 0.6 mg/dL (ref 0.3–0.9)
Total Bilirubin: 0.7 mg/dL (ref 0.3–1.2)
Total Protein: 4.1 g/dL — ABNORMAL LOW (ref 6.5–8.1)

## 2022-11-27 LAB — PHOSPHORUS
Phosphorus: <1 mg/dL — CL (ref 2.5–4.6)
Phosphorus: 2.5 mg/dL (ref 2.5–4.6)

## 2022-11-27 LAB — LACTIC ACID, PLASMA: Lactic Acid, Venous: 2 mmol/L (ref 0.5–1.9)

## 2022-11-27 LAB — AMYLASE: Amylase: 1755 U/L — ABNORMAL HIGH (ref 28–100)

## 2022-11-27 LAB — TRIGLYCERIDES: Triglycerides: 75 mg/dL (ref <150)

## 2022-11-27 LAB — MAGNESIUM: Magnesium: 1.8 mg/dL (ref 1.7–2.4)

## 2022-11-27 LAB — PROCALCITONIN: Procalcitonin: 1.53 ng/mL

## 2022-11-27 LAB — LIPASE, BLOOD: Lipase: 286 U/L — ABNORMAL HIGH (ref 11–51)

## 2022-11-27 MED ORDER — FENTANYL CITRATE PF 50 MCG/ML IJ SOSY: 12.5000 ug | PREFILLED_SYRINGE | INTRAMUSCULAR | Status: DC | PRN

## 2022-11-27 MED ORDER — POTASSIUM CHLORIDE 10 MEQ/100ML IV SOLN
10.0000 meq | INTRAVENOUS | Status: AC
  Administered 2022-11-27 (×2): 10 meq via INTRAVENOUS
  Filled 2022-11-27 (×2): qty 100

## 2022-11-27 MED ORDER — POTASSIUM PHOSPHATES 15 MMOLE/5ML IV SOLN
30.0000 mmol | Freq: Once | INTRAVENOUS | Status: AC
  Administered 2022-11-27: 30 mmol via INTRAVENOUS
  Filled 2022-11-27: qty 10

## 2022-11-27 MED ORDER — TAMSULOSIN HCL 0.4 MG PO CAPS
0.4000 mg | ORAL_CAPSULE | Freq: Every day | ORAL | Status: DC
  Administered 2022-11-28 – 2022-11-30 (×3): 0.4 mg via ORAL
  Filled 2022-11-27 (×3): qty 1

## 2022-11-27 MED ORDER — POTASSIUM & SODIUM PHOSPHATES 280-160-250 MG PO PACK
2.0000 | PACK | Freq: Once | ORAL | Status: AC
  Administered 2022-11-27: 2
  Filled 2022-11-27: qty 2

## 2022-11-27 MED ORDER — ORAL CARE MOUTH RINSE: 15.0000 mL | OROMUCOSAL | Status: DC | PRN

## 2022-11-27 MED ORDER — LACTATED RINGERS IV BOLUS
1000.0000 mL | Freq: Once | INTRAVENOUS | Status: AC
  Administered 2022-11-27: 1000 mL via INTRAVENOUS

## 2022-11-27 MED ORDER — DONEPEZIL HCL 5 MG PO TABS
5.0000 mg | ORAL_TABLET | Freq: Every day | ORAL | Status: DC
  Administered 2022-11-28 – 2022-11-29 (×2): 5 mg
  Filled 2022-11-27 (×3): qty 1

## 2022-11-27 NOTE — Progress Notes (Addendum)
2000 patient alert to self unable to make all need known wife at bedside staying to night to help with patient confusion if needed  2200 unable to give PO meds patient not orientated enough to do bedside swallow orientated to self and just says "ok" to mostly everything else the questions he does answer are not accurate. Held long acting insulin after speaking with CC NP since patient is NPO patient still Q4H glucose checks.

## 2022-11-27 NOTE — Progress Notes (Signed)
NAME:  TERANCE POMPLUN, MRN:  [id number], DOB:  [birth year], LOS: 2 ADMISSION DATE:  11/25/2022, CONSULTATION DATE:  11/25/22 REFERRING MD:  Dr. Scotty Court, CHIEF COMPLAINT:  AMS   Brief Pt Description / Synopsis:  67 y.o. male admitted with Acute metabolic Encephalopathy in the setting of Sever DKA due to Acute Pancreatitis, Ascending colitis, and aspiration requiring intubation for airway protection.   History of Present Illness:  67 year old male presenting to [facility] ED from home via EMS for evaluation of altered mental status in the setting of poorly controlled type 2 diabetes mellitus and early onset Alzheimer's.  History provided bedside by spouse. She reports the patient has been feeling " unwell" and fatigued without any specific complaints for the last couple of days.  He has not been interested in eating or drinking normally, she believes he might not have had any nutrition over the last 2 days.  She arrived home to find him extremely altered and called EMS. She does admit she suspects he is inconsistent with his insulin regimen during the week while she is at work.  At his baseline the patient is able to perform all his own ADLs, but has become increasingly forgetful asking questions multiple times.  While he reports to her he is taking his medication, she has noticed that his blood glucose runs high during the week and comes down on the weekends when she is giving him his injections.  She reported 1 possible vomiting episode that EMS noted in the trash can while she was at work today on 11/25/2022.  She denies any complaints from him about fever/chills, nausea/diarrhea/abdominal pain, shortness of breath/productive cough, chest pain, or urinary symptoms.  She denies any history of tobacco use, ETOH or recreational drug use.  ED course: Upon arrival the patient was stuporous but also somewhat agitated and combative.  Workup appears consistent with DKA and dehydration.  Lab work significant for  hyperkalemia, AKI, anion gap metabolic acidosis, severe hyperglycemia at 863, pseudo mild hyponatremia, elevated total bilirubin, elevated osmolality, leukocytosis and lactic acidosis, with a beta hydroxy greater than 8.  DKA protocol started, shifting measures given for hyperkalemia, and low-dose sedatives given for combative agitation. Medications given: calcium gluconate 1g, benadryl, haldol, ativan, insulin 10 units, sodium bicarb 50 meq, insulin drip started, 2 L IV fluid. Initial Vitals: 22, 108, 171/93 & 100% on RA Significant labs: (Labs/ Imaging personally reviewed) I, Cheryll Cockayne Rust-Chester, AGACNP-BC, personally viewed and interpreted this ECG. EKG Interpretation: Date: 11/25/2022, EKG Time: 16:37, Rate: 104, Rhythm: Sinus tachycardia, QRS Axis: RAD, Intervals: RBBB/LAFB, mildly prolonged QTc, ST/T Wave abnormalities: Nonspecific T wave inversions, peaked T waves, Narrative Interpretation: Sinus tachycardia with RBBB/LAFB, peaked T waves and nonspecific T wave inversions  Chemistry: Na+:132 > 134, K+: 6.7 > 5.8, BUN/Cr.: 55/2.36, Serum CO2/ AG: <7/not calc, Cl: 94, Phos: 9.0, Mg: 2.9, glucose: 863, T. Bili: 2.4 Hematology: WBC: 23, Hgb: 11.8,  Troponin: 55, Lactic/ PCT: 6.0/ 0.29, COVID-19 & Influenza A/B: negative CK: 149, osmolality: 368, lipase: 285  HA1C: 12.8, Beta-hydroxy: >8 VBG: <6.95/ 23/59/4.8  CXR 11/25/2022: some linear opacity of the left lung base considered to be mild left basilar atelectasis. CT head without contrast 11/25/2022: No acute intracranial findings with stable mild atrophy and small vessel change.  Partial opacification of the left ethmoid air cells with increased fluid in the lower mastoid air cells bilaterally.   CT abdomen and pelvis without contrast 11/25/2022: bilateral left greater than right lower lobe airspace consolidation with air bronchograms  most likely due to pneumonia or aspiration.  Calcification in the aortic valve leaflets.  Acute interstitial  pancreatitis with minimal nonlocalizing fluid around the head of the pancreas extending to the pancreaticoduodenal groove.  No peripancreatic abscess or hemorrhage.  Cholelithiasis without evidence of cholecystitis.  Nonobstructive nephro lithiasis.  Nonspecific enteritis/enteropathy with thickened folds in the left abdominal small bowel and in the pelvic small bowel and mesenteric inflammatory reaction in the left abdomen.  Ascending colitis versus non distention, trace pelvic ascites, diverticulosis without evidence of diverticulitis, prostate a megaly  PCCM consulted for admission due to severe DKA with severe anion gap metabolic acidosis requiring closer monitoring and sodium bicarbonate supplementation.  Upon arrival to ED room patient was unable to follow commands but agitated thrashing in the bed requiring nursing to hold his arms while trying to get lab work.  During my conversation with his spouse the patient became increasingly somnolent eventually a RASS of -5.  Due to concerns for airway protection patient was emergently intubated requiring mechanical ventilatory support.  Pertinent  Medical History  T2DM HTN ED GERD H. Pylori (2010) Mitral valve disorder Chronic pain of the cervical spine Early onset alzheimers OSA?  Micro Data:  1/25: SARS-CoV-2/RSV/Flu PCR >> negative 1/25: HIV screen>> nonreactive 1/25: MRSA PCR>>negative 1/26: Blood culture x2>>no growth to date 1/26: Tracheal aspirate>> Gram + cocci  Antimicrobials:  Unasyn 1/25>>1/26 Zosyn 1/26>>  Significant Hospital Events: Including procedures, antibiotic start and stop dates in addition to other pertinent events   11/25/2022 : Admit to ICU with severe DKA with severe anion gap metabolic acidosis and coma requiring intubation and mechanical ventilatory support. 11/26/2022: Remains on vent and low dose Levophed.  DKA/AG metabolic acidosis/AKI improving. 11/27/2022: DKA resolved, on minimal vent support and good  respiratory mechanics, with continued encephalopathy.  Preliminary tracheal aspirate results with gram + cocci.  Pt's wife would like trial of extubation ~ EXTUBATED  Interim History / Subjective:  -No significant events overnight -Afebrile, requiring very low dose Levophed -On minimal vent support, remains Encephalopathic, which will complicate extubation ~ waiting for wife's arrival this morning for WUA and SBT ~ given that pt is on minimal vent settings with good respiratory mechanics, wife wishes to give extubation a trial and see if pt is able to maintain his airway, and would reintubate if he is unable to do so -DKA is resolved ~ CBG's controlled  on SSI + basal insulin  -WBC improved to 6.2 from 9.5 -Creatinine virtually unchanged at 1.4 from 1.42 ; UOP 920 cc (net + 8.1 L) -Preliminary tracheal aspirate with gram + cocci -Echo from yesterday with Grade I DD and mildly reduced RV systolic function ~ but no mention of aortic calcification    Objective   Blood pressure (!) 90/58, pulse (!) 52, temperature (!) 97.5 F (36.4 C), resp. rate 14, height 5\' 10"  (1.778 m), weight 61.7 kg, SpO2 98 %.    Vent Mode: PRVC FiO2 (%):  [24 %-30 %] 24 % Set Rate:  [14 bmp] 14 bmp Vt Set:  [570 mL] 570 mL PEEP:  [5 cmH20] 5 cmH20 Plateau Pressure:  [12 cmH20] 12 cmH20   Intake/Output Summary (Last 24 hours) at 11/27/2022 0802 Last data filed at 11/27/2022 0746 Gross per 24 hour  Intake 5541.07 ml  Output 830 ml  Net 4711.07 ml    Filed Weights   11/25/22 1758 11/26/22 0500 11/27/22 0440  Weight: 65 kg 65 kg 61.7 kg    Examination: General: Adult male, critically  ill, lying in bed intubated & sedated requiring mechanical ventilation, NAD HEENT: MM pink/dry, anicteric, atraumatic, neck supple, orally intubated Neuro: Lightly sedated, arouses to gentle stimulation, moves all extremities, but unable to redirect and currently not following commands, pupils PERRL CV: s1s2 RRR, ST on monitor, no  r/m/g Pulm: Mechanical breath sounds throughout, occasionally overbreathes the vent, even, nonlabored GI: soft, flat, non tender, bs x 4 GU: foley in place  with clear yellow urine Skin: no rashes/lesions noted Extremities: warm/dry, pulses + 2 R/P, no edema noted  Resolved Hospital Problem list     Assessment & Plan:   #Severe diabetic ketoacidosis ~ RESOLVED PMHx: Poorly controlled T2DM Patient has a V-go device on.  Discussed with wife she believes this device is from 11/24/2022, but it does appear as though it has been on the skin for quite a while.  I can see liquid inside the cartridge as well.  His wife confirmed this device should be giving him long-acting insulin continuously and then you need to manually click the device to give SSI with meals.  She reports he is not good at remembering to take his SSI coverage, however she also admits that device previously had malfunctioned which might be possible in this case. -CBG's q4h; Target range of 140 to 180 -SSI + basal insulin (Semglee 10 units BID) -Follow ICU Hypo/Hyperglycemia protocol -Diabetes Coordinator consulted, appreciate input   #Acute Respiratory Failure secondary to acute encephalopathy with aspiration in the setting of DKA and sedative administration, along with multiple metabolic derangements #Suspect aspiration pneumonia -Full vent support, implement lung protective strategies -Plateau pressures less than 30 cm H20 -Wean FiO2 & PEEP as tolerated to maintain O2 sats >92% -Follow intermittent Chest X-ray & ABG as needed -Spontaneous Breathing Trials when respiratory parameters met and mental status permits ~ mental status is currently complicating extubation -Implement VAP Bundle -Prn Bronchodilators -ABX as above  #Severe Sepsis in setting of Aspiration, Acute Pancreatitis, & Ascending colitis -Monitor fever curve -Trend WBC's & Procalcitonin -Follow cultures as above -Continue empiric Zosyn pending cultures &  sensitivities  #Shock: Hypovolemic +/- Septic ~ IMPROVED #Small Pericardial Effusion & calcification on aortic leaflets noted on CT imaging RBBB noted on EKG #Mildly elevated troponin secondary to NSTEMI versus demand ischemia Echocardiogram 11/16/22: LVEF 60-65%, Grade I DD, mildly reduced RV systolic function, no mention of aortic valve calcification -Continuous cardiac monitoring -Maintain MAP >65 -IV fluids -Vasopressors as needed to maintain MAP goal -Trend lactic acid until normalized (6 ~ 2.5 ~ 2.9 ~ 4.5 ~) -Trend HS Troponin until peaked (55 ~ 61 ~) -Outpatient antihypertensives on hold given continuous sedatives  #Acute interstitial pancreatitis without necrotizing features or abscess, suspect due to cholelithiasis CT Abdomen & Pelvis concerning for acute pancreatitis, no peripancreatic abscess or hemorrhage; cholelithiasis without evidence of cholecystitis, NO biliary dilation - Trend pancreatic enzymes & hepatic function - NPO ~ will advance diet as tolerated once passed bedside swallow - continue aggressive IVF resuscitation & pain control - f/u lipase, amylase -Likely will need to follow up with General Surgery as outpatient for evaluation of cholecystectomy  #Acute Kidney Injury secondary to hypovolemia and dehydration in the setting of DKA ~ IMPROVING #Severe Anion Gap Metabolic Acidosis ~ RESOLVED #Hyperkalemia ~ RESOLVED #Pseudo-hyponatremia secondary to hyperglycemia ~ RESOLVED PMHx: Kidney stones Baseline Cr: 1.09, Cr on admission: 2.36 -Monitor I&O's / urinary output -Follow BMP -Ensure adequate renal perfusion -Avoid nephrotoxic agents as able -Replace electrolytes as indicated~ Pharmacy following for assistance -IV fluids  #Query  GIB ~ STABLE Emesis Dark- almost coffee ground -PPI BID -Monitor for S/Sx of bleeding -Trend CBC -SCD's for VTE Prophylaxis  -Transfuse for Hgb <7 -Consider GI consult  #Acute encephalopathy suspect metabolic #Early onset  Alzheimer's #Sedation needs in setting of mechanical ventilation At reported baseline the patient is independent of ADLs but forgetful -CT Head negative; UDS negative -Treatment of DKA and multiple metabolic derangements as outlined above -Maintain a RASS goal of 0 to -1 -Prn Fentanyl and versed pushes to maintain RASS goal -Avoid sedating medications as able -Daily wake up assessment -Continue home Aricept    Pt is critically ill, prognosis is guarded.  High risk for cardiac arrest and death.   Best Practice (right click and "Reselect all SmartList Selections" daily)  Diet/type: NPO, trickle feeds DVT prophylaxis: prophylactic heparin  GI prophylaxis: PPI Lines: N/A Foley:  Yes, and it is still needed Code Status:  full code Last date of multidisciplinary goals of care discussion [11/27/2022]  1/27: Update pt's wife Renee at bedside.  Labs   CBC: Recent Labs  Lab 11/25/22 1700 11/26/22 0304 11/26/22 1610 11/27/22 0426  WBC 23.0* 9.5  --  6.2  NEUTROABS 17.1*  --   --   --   HGB 11.8* 9.8* 11.6* 9.7*  HCT 41.1 30.9* 34.2* 28.5*  MCV 106.5* 96.9  --  89.9  PLT 315 169  --  135*     Basic Metabolic Panel: Recent Labs  Lab 11/25/22 1700 11/25/22 1931 11/26/22 0304 11/26/22 0935 11/26/22 1235 11/26/22 1610 11/26/22 1946 11/26/22 2347 11/27/22 0417 11/27/22 0426  NA 132*   < > 140   < > 143 144 144 142  --  144  K 6.7*   < > 3.8   < > 3.6 3.7 3.4* 3.3*  --  3.9  CL 94*   < > 102   < > 106 106 105 105  --  109  CO2 <7*   < > 15*   < > 25 28 31 29   --  28  GLUCOSE 863*   < > 570*   < > 275* 182* 138* 129*  --  141*  BUN 55*   < > 49*   < > 40* 40* 37* 35*  --  32*  CREATININE 2.36*   < > 2.10*   < > 1.57* 1.53* 1.47* 1.42*  --  1.41*  CALCIUM 9.8   < > 9.2   < > 9.3 9.1 9.1 8.6*  --  8.7*  MG 2.9*  --  2.3  --   --   --   --   --   --  1.8  PHOS 9.0*  --  2.6  --   --   --   --   --  <1.0*  --    < > = values in this interval not displayed.     GFR: Estimated Creatinine Clearance: 45.6 mL/min (A) (by C-G formula based on SCr of 1.41 mg/dL (H)). Recent Labs  Lab 11/25/22 1700 11/25/22 1931 11/26/22 0004 11/26/22 0304 11/26/22 0935 11/27/22 0426  PROCALCITON 0.29  --   --  1.21  --  1.53  WBC 23.0*  --   --  9.5  --  6.2  LATICACIDVEN  --  6.0* 2.5* 2.9* 4.5*  --      Liver Function Tests: Recent Labs  Lab 11/25/22 1700 11/26/22 0259 11/27/22 0426  AST 25 22 28   ALT 21 20 16   ALKPHOS  94 68 59  BILITOT 2.4* 2.3* 0.7  PROT 6.2* 4.6* 4.1*  ALBUMIN 3.7 2.7* 2.2*    Recent Labs  Lab 11/25/22 1931 11/26/22 0259 11/26/22 0304 11/27/22 0426  LIPASE 285* 262*  --  286*  AMYLASE  --  3,279* 3,030* 1,755*    No results for input(s): "AMMONIA" in the last 168 hours.  ABG    Component Value Date/Time   PHART 7.06 (LL) 11/25/2022 2135   PCO2ART 35 11/25/2022 2135   PO2ART 82 (L) 11/25/2022 2135   HCO3 10.6 (L) 11/25/2022 2135   ACIDBASEDEF 20.1 (H) 11/25/2022 2135   O2SAT 100 11/25/2022 2135     Coagulation Profile: No results for input(s): "INR", "PROTIME" in the last 168 hours.  Cardiac Enzymes: Recent Labs  Lab 11/25/22 1931  CKTOTAL 149     HbA1C: Hgb A1c MFr Bld  Date/Time Value Ref Range Status  11/25/2022 05:00 PM 12.8 (H) 4.8 - 5.6 % Final    Comment:    (NOTE) Pre diabetes:          5.7%-6.4%  Diabetes:              >6.4%  Glycemic control for   <7.0% adults with diabetes     CBG: Recent Labs  Lab 11/26/22 2217 11/26/22 2320 11/27/22 0048 11/27/22 0438 11/27/22 0735  GLUCAP 152* 145* 103* 152* 182*     Review of Systems:   UTA- intubated/sedated/AMS  Past Medical History:  He,  has a past medical history of Diabetes mellitus without complication (HCC) and Hypertension.   Surgical History:   Past Surgical History:  Procedure Laterality Date   HERNIA REPAIR       Social History:   reports that he has never smoked. He has never used smokeless tobacco. He  reports that he does not currently use alcohol. He reports that he does not currently use drugs.   Family History:  His family history is not on file.   Allergies Allergies  Allergen Reactions   Codeine Hives   Morphine And Related Hives     Home Medications  Prior to Admission medications   Medication Sig Start Date End Date Taking? Authorizing Provider  insulin aspart (NOVOLOG) 100 UNIT/ML injection Inject 76 Units into the skin daily. In V-Go delivery system 11/27/21  Yes [provider]  insulin degludec (TRESIBA) 100 UNIT/ML FlexTouch Pen Inject 12 Units into the skin at bedtime. 08/24/22  Yes [provider]  donepezil (ARICEPT) 5 MG tablet Take 5 mg by mouth daily.    [provider]  HYDROcodone-acetaminophen (NORCO) 5-325 MG tablet Take 1 tablet by mouth every 6 (six) hours as needed for up to 7 doses for severe pain. 02/25/18   Merrily Brittle, MD  ibuprofen (ADVIL,MOTRIN) 600 MG tablet Take 1 tablet (600 mg total) by mouth every 8 (eight) hours as needed. 02/25/18   Merrily Brittle, MD  metFORMIN (GLUCOPHAGE) 1000 MG tablet Take 1,000 mg by mouth 2 (two) times daily.    [provider]  tamsulosin (FLOMAX) 0.4 MG CAPS capsule Take 1 capsule (0.4 mg total) by mouth daily. 02/25/18   Merrily Brittle, MD     Critical care time: 40 minutes     Harlon Ditty, AGACNP-BC [facility] Prefer epic messenger for cross cover needs If after hours, please call E-link

## 2022-11-27 NOTE — Progress Notes (Signed)
Patient extubated to room air at 1025. Family at bedside. Providers at bedside. Patient initially agitated but calmed down with wife's reassurance and reorientation. IV's wrapped with coband to avoid being removed. Safety mitts removed as these were agitating patient more. Patient is calm and cooperative, oriented to self.

## 2022-11-27 NOTE — Consult Note (Signed)
PHARMACY CONSULT NOTE - FOLLOW UP  Pharmacy Consult for Electrolyte Monitoring and Replacement   Recent Labs: Potassium (mmol/L)  Date Value  11/27/2022 3.9  12/14/2011 4.0   Magnesium (mg/dL)  Date Value  11/27/2022 1.8   Calcium (mg/dL)  Date Value  11/27/2022 8.7 (L)   Calcium, Total (mg/dL)  Date Value  12/14/2011 9.4   Albumin (g/dL)  Date Value  11/27/2022 2.2 (L)  12/14/2011 4.3   Phosphorus (mg/dL)  Date Value  11/27/2022 2.5   Sodium (mmol/L)  Date Value  11/27/2022 144  12/14/2011 136     Assessment: 67 year old male admitted with DKA on insulin infusion. Pharmacy consulted for repletion and monitoring of electrolytes.  Goal of Therapy:  Electrolytes WNL  Plan:  --No need for replacement at this time. --Recheck labs 1/28 with AM labs  Beatris Si, PharmD 11/27/2022 6:21 PM

## 2022-11-27 NOTE — Consult Note (Signed)
PHARMACY CONSULT NOTE - FOLLOW UP  Pharmacy Consult for Electrolyte Monitoring and Replacement   Recent Labs: Potassium (mmol/L)  Date Value  11/27/2022 3.9  12/14/2011 4.0   Magnesium (mg/dL)  Date Value  11/27/2022 1.8   Calcium (mg/dL)  Date Value  11/27/2022 8.7 (L)   Calcium, Total (mg/dL)  Date Value  12/14/2011 9.4   Albumin (g/dL)  Date Value  11/27/2022 2.2 (L)  12/14/2011 4.3   Phosphorus (mg/dL)  Date Value  11/27/2022 <1.0 (LL)   Sodium (mmol/L)  Date Value  11/27/2022 144  12/14/2011 136     Assessment: 67 year old male admitted with DKA on insulin infusion. Pharmacy consulted for repletion and monitoring of electrolytes.  Goal of Therapy:  Electrolytes WNL  Plan:  Phos <1. Ordered  500 mg Phosphate (Packets) per tube once, to be followed by 30 mmol (0.5 mmol/kg) Potassium Phosphate IV once.  Will recheck Phos lvl at 1800.   Renda Rolls, PharmD, [facility] 11/27/2022 6:15 AM

## 2022-11-27 NOTE — Progress Notes (Signed)
Patient extubated to room air with no complications, per order. 2 liter [place] set-up at the bedside for use if needed, Sats 100% at this time on RA.

## 2022-11-27 NOTE — Progress Notes (Signed)
Patient extremely agitated and combative upon initial assessment. Patient opens eyes but does not follow commands. Patient thrashing about bed and swinging arms and legs at staff, attempting to pull at lines and tubes. Darel Hong, NP to bedside to assess patient.

## 2022-11-27 NOTE — Plan of Care (Signed)
Problem: Education: Goal: Ability to describe self-care measures that may prevent or decrease complications (Diabetes Survival Skills Education) will improve Outcome: Not Progressing Goal: Individualized Educational Video(s) Outcome: Not Progressing   Problem: Coping: Goal: Ability to adjust to condition or change in health will improve Outcome: Not Progressing   Problem: Fluid Volume: Goal: Ability to maintain a balanced intake and output will improve Outcome: Progressing   Problem: Health Behavior/Discharge Planning: Goal: Ability to identify and utilize available resources and services will improve Outcome: Not Progressing Goal: Ability to manage health-related needs will improve Outcome: Not Progressing   Problem: Metabolic: Goal: Ability to maintain appropriate glucose levels will improve Outcome: Not Progressing   Problem: Nutritional: Goal: Maintenance of adequate nutrition will improve Outcome: Not Progressing Goal: Progress toward achieving an optimal weight will improve Outcome: Not Progressing   Problem: Skin Integrity: Goal: Risk for impaired skin integrity will decrease Outcome: Not Progressing   Problem: Tissue Perfusion: Goal: Adequacy of tissue perfusion will improve Outcome: Progressing   Problem: Education: Goal: Ability to describe self-care measures that may prevent or decrease complications (Diabetes Survival Skills Education) will improve Outcome: Not Progressing Goal: Individualized Educational Video(s) Outcome: Not Progressing   Problem: Cardiac: Goal: Ability to maintain an adequate cardiac output will improve Outcome: Not Progressing   Problem: Health Behavior/Discharge Planning: Goal: Ability to identify and utilize available resources and services will improve Outcome: Not Progressing Goal: Ability to manage health-related needs will improve Outcome: Not Progressing   Problem: Fluid Volume: Goal: Ability to achieve a balanced intake  and output will improve Outcome: Progressing   Problem: Metabolic: Goal: Ability to maintain appropriate glucose levels will improve Outcome: Progressing   Problem: Nutritional: Goal: Maintenance of adequate nutrition will improve Outcome: Not Progressing Goal: Maintenance of adequate weight for body size and type will improve Outcome: Not Progressing   Problem: Respiratory: Goal: Will regain and/or maintain adequate ventilation Outcome: Progressing   Problem: Urinary Elimination: Goal: Ability to achieve and maintain adequate renal perfusion and functioning will improve Outcome: Progressing   Problem: Activity: Goal: Ability to tolerate increased activity will improve Outcome: Completed/Met   Problem: Respiratory: Goal: Ability to maintain a clear airway and adequate ventilation will improve Outcome: Progressing   Problem: Role Relationship: Goal: Method of communication will improve Outcome: Progressing   Problem: Education: Goal: Knowledge of General Education information will improve Description: Including pain rating scale, medication(s)/side effects and non-pharmacologic comfort measures Outcome: Not Progressing   Problem: Health Behavior/Discharge Planning: Goal: Ability to manage health-related needs will improve Outcome: Not Progressing   Problem: Clinical Measurements: Goal: Ability to maintain clinical measurements within normal limits will improve Outcome: Not Progressing Goal: Will remain free from infection Outcome: Not Progressing Goal: Diagnostic test results will improve Outcome: Progressing Goal: Respiratory complications will improve Outcome: Progressing Goal: Cardiovascular complication will be avoided Outcome: Progressing   Problem: Activity: Goal: Risk for activity intolerance will decrease Outcome: Not Progressing   Problem: Nutrition: Goal: Adequate nutrition will be maintained Outcome: Not Progressing   Problem: Coping: Goal:  Level of anxiety will decrease Outcome: Not Progressing   Problem: Elimination: Goal: Will not experience complications related to bowel motility Outcome: Not Progressing Goal: Will not experience complications related to urinary retention Outcome: Not Progressing   Problem: Pain Managment: Goal: General experience of comfort will improve Outcome: Progressing   Problem: Safety: Goal: Ability to remain free from injury will improve Outcome: Not Progressing   Problem: Skin Integrity: Goal: Risk for impaired skin integrity will decrease Outcome:  Progressing

## 2022-11-28 DIAGNOSIS — E1011 Type 1 diabetes mellitus with ketoacidosis with coma: Secondary | ICD-10-CM

## 2022-11-28 LAB — BASIC METABOLIC PANEL
Anion gap: 7 (ref 5–15)
BUN: 22 mg/dL (ref 8–23)
CO2: 28 mmol/L (ref 22–32)
Calcium: 8.4 mg/dL — ABNORMAL LOW (ref 8.9–10.3)
Chloride: 106 mmol/L (ref 98–111)
Creatinine, Ser: 1.34 mg/dL — ABNORMAL HIGH (ref 0.61–1.24)
GFR, Estimated: 59 mL/min — ABNORMAL LOW (ref >60)
Glucose, Bld: 133 mg/dL — ABNORMAL HIGH (ref 70–99)
Potassium: 3.7 mmol/L (ref 3.5–5.1)
Sodium: 141 mmol/L (ref 135–145)

## 2022-11-28 LAB — RENAL FUNCTION PANEL
Albumin: 2.3 g/dL — ABNORMAL LOW (ref 3.5–5.0)
Anion gap: 7 (ref 5–15)
BUN: 22 mg/dL (ref 8–23)
CO2: 28 mmol/L (ref 22–32)
Calcium: 8.5 mg/dL — ABNORMAL LOW (ref 8.9–10.3)
Chloride: 107 mmol/L (ref 98–111)
Creatinine, Ser: 1.28 mg/dL — ABNORMAL HIGH (ref 0.61–1.24)
GFR, Estimated: >60 mL/min (ref >60)
Glucose, Bld: 136 mg/dL — ABNORMAL HIGH (ref 70–99)
Phosphorus: 2.8 mg/dL (ref 2.5–4.6)
Potassium: 3.6 mmol/L (ref 3.5–5.1)
Sodium: 142 mmol/L (ref 135–145)

## 2022-11-28 LAB — CBC
HCT: 31.8 % — ABNORMAL LOW (ref 39.0–52.0)
Hemoglobin: 10.2 g/dL — ABNORMAL LOW (ref 13.0–17.0)
MCH: 30.2 pg (ref 26.0–34.0)
MCHC: 32.1 g/dL (ref 30.0–36.0)
MCV: 94.1 fL (ref 80.0–100.0)
Platelets: 119 10*3/uL — ABNORMAL LOW (ref 150–400)
RBC: 3.38 MIL/uL — ABNORMAL LOW (ref 4.22–5.81)
RDW: 14.3 % (ref 11.5–15.5)
WBC: 6.5 10*3/uL (ref 4.0–10.5)
nRBC: 0 % (ref 0.0–0.2)

## 2022-11-28 LAB — GLUCOSE, CAPILLARY
Glucose-Capillary: 103 mg/dL — ABNORMAL HIGH (ref 70–99)
Glucose-Capillary: 153 mg/dL — ABNORMAL HIGH (ref 70–99)
Glucose-Capillary: 187 mg/dL — ABNORMAL HIGH (ref 70–99)
Glucose-Capillary: 194 mg/dL — ABNORMAL HIGH (ref 70–99)
Glucose-Capillary: 213 mg/dL — ABNORMAL HIGH (ref 70–99)

## 2022-11-28 LAB — MAGNESIUM: Magnesium: 1.8 mg/dL (ref 1.7–2.4)

## 2022-11-28 MED ORDER — DIPHENHYDRAMINE HCL 25 MG PO CAPS
25.0000 mg | ORAL_CAPSULE | Freq: Every evening | ORAL | Status: DC | PRN
  Administered 2022-11-28: 25 mg via ORAL
  Filled 2022-11-28 (×2): qty 1

## 2022-11-28 MED ORDER — NIFEDIPINE ER OSMOTIC RELEASE 30 MG PO TB24
30.0000 mg | ORAL_TABLET | Freq: Every day | ORAL | Status: DC
  Administered 2022-11-28 – 2022-11-30 (×3): 30 mg via ORAL
  Filled 2022-11-28 (×3): qty 1

## 2022-11-28 NOTE — Consult Note (Signed)
Subjective:   CC: Cholelithiasis and interstitial pancreatitis in the setting of DKA  HPI:  Alan Nunez is a 67 y.o. male who is consulted by Dewaine Conger for evaluation of above cc.  Patient recently admitted for DKA and just extubated yesterday.  Workup during the process noted stones and pancreatitis.  Surgery consulted for possible gallstone pancreatitis.  Patient still somewhat somnolent but reports no previous episode of abdominal pain prior to hospitalization.  Reports acknowledging having diabetes    Past Medical History:  has a past medical history of Diabetes mellitus without complication ([place]) and Hypertension.  Past Surgical History:  has a past surgical history that includes Hernia repair.  Family History: family history is not on file.  Social History:  reports that he has never smoked. He has never used smokeless tobacco. He reports that he does not currently use alcohol. He reports that he does not currently use drugs.  Current Medications:  Prior to Admission medications   Medication Sig Start Date End Date Taking? Authorizing Provider  acetaminophen (TYLENOL) 650 MG CR tablet Take 650 mg by mouth every 8 (eight) hours as needed for pain.   Yes [provider]  donepezil (ARICEPT) 5 MG tablet Take 5 mg by mouth daily.   Yes [provider]  insulin aspart (NOVOLOG) 100 UNIT/ML injection Inject 76 Units into the skin daily. In V-Go delivery system 11/27/21  Yes [provider]  insulin degludec (TRESIBA) 100 UNIT/ML FlexTouch Pen Inject 12 Units into the skin daily. 08/24/22  Yes [provider]  losartan (COZAAR) 100 MG tablet Take 100 mg by mouth daily.   Yes [provider]  metFORMIN (GLUCOPHAGE) 1000 MG tablet Take 1,000 mg by mouth 2 (two) times daily.   Yes [provider]  tamsulosin (FLOMAX) 0.4 MG CAPS capsule Take 1 capsule (0.4 mg total) by mouth daily. 02/25/18  Yes Darel Hong, MD    Allergies:  Allergies  as of 11/25/2022 - Review Complete 11/25/2022  Allergen Reaction Noted   Codeine Hives 02/25/2018   Morphine and related Hives 02/25/2018    ROS:  Unable to obtain secondary to patient mentation.  Limited pertinent positives and negatives noted in H&P   Objective:     BP (!) 177/93   Pulse 75   Temp 98.2 F (36.8 C) (Oral)   Resp 17   Ht 5\' 10"  (1.778 m)   Wt 72.4 kg   SpO2 90%   BMI 22.90 kg/m    Constitutional :  alert, cooperative, appears stated age, and fatigued  Lymphatics/Throat:  no asymmetry, masses, or scars  Respiratory:  clear to auscultation bilaterally  Cardiovascular:  regular rate and rhythm  Gastrointestinal: Soft, no guarding, tenderness to palpation in epigastric region mild .   Musculoskeletal: Steady movement  Skin: Cool and moist, no surgical scars  Psychiatric: Normal affect, non-agitated, not confused       LABS:     Latest Ref Rng & Units 11/28/2022    4:43 AM 11/28/2022    4:42 AM 11/27/2022    4:26 AM  CMP  Glucose 70 - 99 mg/dL 136  133  141   BUN 8 - 23 mg/dL 22  22  32   Creatinine 0.61 - 1.24 mg/dL 1.28  1.34  1.41   Sodium 135 - 145 mmol/L 142  141  144   Potassium 3.5 - 5.1 mmol/L 3.6  3.7  3.9   Chloride 98 - 111 mmol/L 107  106  109  CO2 22 - 32 mmol/L 28  28  28    Calcium 8.9 - 10.3 mg/dL 8.5  8.4  8.7   Total Protein 6.5 - 8.1 g/dL   4.1   Total Bilirubin 0.3 - 1.2 mg/dL   0.7   Alkaline Phos 38 - 126 U/L   59   AST 15 - 41 U/L   28   ALT 0 - 44 U/L   16       Latest Ref Rng & Units 11/28/2022    4:42 AM 11/27/2022    4:26 AM 11/26/2022    4:10 PM  CBC  WBC 4.0 - 10.5 K/uL 6.5  6.2    Hemoglobin 13.0 - 17.0 g/dL 11/28/2022  9.7  27.0   Hematocrit 39.0 - 52.0 % 31.8  28.5  34.2   Platelets 150 - 400 K/uL 119  135       RADS: CLINICAL DATA:  Mental status changes, unknown cause, with hyperglycemia and possible sepsis.   EXAM: CT HEAD WITHOUT CONTRAST   CT ABDOMEN AND PELVIS WITHOUT CONTRAST   TECHNIQUE: Contiguous  axial images were obtained from the base of the skull through the vertex without intravenous contrast. Multiplanar CT image reconstructions were also generated. Multidetector CT imaging of the abdomen and pelvis was performed following the standard protocol without IV contrast. Multiplanar CT image reconstructions were also generated.   RADIATION DOSE REDUCTION: This exam was performed according to the departmental dose-optimization program which includes automated exposure control, adjustment of the mA and/or kV according to patient size and/or use of iterative reconstruction technique.   COMPARISON:  MRI brain 06/10/2020, CT abdomen and pelvis with contrast 02/25/2018, CT abdomen and pelvis without contrast 09/15/2010.   FINDINGS: CT HEAD FINDINGS   Brain: There is mild cerebral atrophy, small-vessel disease and atrophic ventriculomegaly. Unremarkable cerebellum and brainstem. No asymmetry is seen concerning for an acute cortical based infarct, hemorrhage, mass or mass effect. There is no midline shift. Basal cisterns are patent.   Vascular: There are patchy calcifications in the carotid siphons but no hyperdense central vasculature.   Skull: Negative for fractures or focal lesions.   Sinuses/Orbits: Negative orbits apart from old lens replacements. There is patchy opacification of the left ethmoid air cells increased from the prior study. The other paranasal sinuses are clear. There is patchy fluid in the lower mastoid air cells on both sides also increased.   Other: Orotracheal and oroenteric intubation.   CT ABDOMEN AND PELVIS FINDINGS   Technical note: Streak artifact from the patient's arms in the field and due to overlying metallic wiring, limits the abdominal study.   Lower chest: Interval new finding patchy airspace consolidation with air bronchograms in the left lower lobe basal segments and in the posteromedial basal right lower lobe. Findings most likely due  to pneumonia or aspiration.   Small anterior pericardial effusion new from prior studies. The cardiac size is normal. There are calcifications in the left main coronary artery and there are calcifications in the aortic valve leaflets.   Hepatobiliary: The liver is 18 cm length and mildly steatotic. No mass is seen through the streak and metallic artifacts. There are several small layering stones in the proximal gallbladder but no wall thickening no biliary dilatation.   Pancreas: There is pancreatic and peripancreatic edema consistent with acute interstitial pancreatitis. There is minimal nonlocalizing fluid around the head of pancreas extending into the pancreaticoduodenal groove with no other visible peripancreatic fluid. No mass is seen without contrast,  no peripancreatic abscess or hemorrhage.   Spleen: Unremarkable without contrast.   Adrenals/Urinary Tract: There is no adrenal mass. Bilateral perinephric stranding is similar to both prior studies consistent with chronic senescent stranding.   There is a small chronic cyst in the medial lower pole left kidney. No other contour deforming renal abnormality is seen.   There are multiple bilateral nonobstructive calyceal stones, measuring from 2-5 mm on both sides but there is no hydronephrosis or ureteral stone on either side. The bladder is catheterized and mostly decompressed, but unremarkable for the degree of distention.   Stomach/Bowel: NGT is in place terminating in the distal gastric body. There is mild gastric distention with air and fluid. The unopacified small bowel is normal caliber.   There are thickened folds in the left abdominal small bowel with adjacent hazy mesenteric reaction, findings consistent with nonspecific enteritis. There are thickened folds in the pelvic small bowel without mesenteric inflammatory change.   The appendix is normal. There is wall prominence versus nondistention in the ascending  colon, left colonic diverticulosis without evidence of acute diverticulitis.   Vascular/Lymphatic: Multiple pelvic phleboliths. There is mild aortoiliac atherosclerotic calcification without AAA. No lymphadenopathy is seen. There is mild aortic branch vessel atherosclerosis.   Reproductive: The prostate is slightly enlarged but unchanged.   Other: Trace pelvic ascites. No free air, free hemorrhage or incarcerated hernia. There are tiny inguinal fat hernias.   Musculoskeletal: Mild osteopenia and degenerative change lumbar spine. No primary pathologic bone lesion is seen.   IMPRESSION: 1. No acute intracranial CT findings or interval changes. Stable mild atrophy and small vessel change. 2. Partial opacification of left ethmoid air cells, with increased fluid in the lower mastoid air cells bilaterally. 3. Bilateral left-greater-than-right lower lobe airspace consolidation with air bronchograms, most likely due to pneumonia or aspiration. 4. Aortic and coronary artery atherosclerosis. 5. Calcification in the aortic valve leaflets. Echocardiographic assessment may be indicated. 6. Orotracheal and oroenteric intubation. 7. CT evidence for acute interstitial pancreatitis with minimal nonlocalizing fluid around the head of the pancreas extending into the pancreaticoduodenal groove. No peripancreatic abscess or hemorrhage. 8. Cholelithiasis without evidence of cholecystitis. 9. Nonobstructive nephrolithiasis. 10. Nonspecific enteritis/enteropathy with thickened folds in the left abdominal small bowel and in the pelvic small bowel, and mesenteric inflammatory reaction in the left abdomen. 11. Ascending colitis versus nondistention. 12. Trace pelvic ascites. 13. Diverticulosis without evidence of diverticulitis. 14. Prostatomegaly.   Aortic Atherosclerosis (ICD10-I70.0).     Electronically Signed   By: Telford Nab M.D.   On: 11/25/2022 21:12   Assessment:       Cholelithiasis and interstitial pancreatitis in the setting of DKA. Elevated triglycerides to the mid 200s noted as well but that has resolved now  Plan:    Difficult to determine exactly cause of pancreatitis.  Gallstones likely but cannot completely ignore his DKA episode causing acute elevation in triglycerides which has been reported to also cause mild pancreatitis.  Regardless, patient is still recovering from acute episode of DKA requiring intubation.  Will likely hold off on proceeding with any sort of surgical intervention during this admission and consider elective cholecystectomy patient basis.  Patient has a very high risk of perioperative complications from his uncontrolled diabetes as well.  Okay to resume diet once deemed safe to do so per primary team.  Surgery will continue to follow through his stay.  labs/images/medications/previous chart entries reviewed personally and relevant changes/updates noted above.

## 2022-11-28 NOTE — Progress Notes (Signed)
  Progress Note   Patient: Alan Nunez LZJ:[id number] DOB: 1955-11-10 DOA: 11/25/2022     3 DOS: the patient was seen and examined on 11/28/2022   Brief hospital course:  Assessment and Plan:  Severe diabetic ketoacidosis  - Resolved  - Check A1c - Novolog SS - Semglee 10 units sq bid    Acute Respiratory Failure secondary to acute encephalopathy with aspiration in the setting of DKA - Resolved  - Proventil q4 hr PRN  - SLP has advised Diet DYS 3    Severe Sepsis in setting of Aspiration, Acute Pancreatitis, & Ascending colitis - IV zosyn 3.375 g q8hr   Mildly elevated troponin secondary to NSTEMI versus demand ischemia - ECHO LVEF 60-65% with no wall motion abnormalities  - Monitor    Acute interstitial pancreatitis without necrotizing features or abscess, suspect due to cholelithiasis - IV antibx as above    Acute Kidney Injury secondary to hypovolemia and dehydration in the setting of DKA  - IV LR 125 cc/hr    Query GIB - Hgb stable  - IV protonix 40 mg q12    Acute encephalopathy suspect metabolic - Resolved  - Aricept 5 mg daily   HTN - Nifedipine 24hr 30 mg PO daily   DVT prophylaxis: Heparin 5000 units sq q8hr       Subjective: Pt seen and examined at the bedside. Glucose is wnl. Hgb stable. PT/OT and SLP consulted today. Diet per SLP. Continue with IV antibx and mobilization.   Physical Exam: Vitals:   11/28/22 1000 11/28/22 1100 11/28/22 1200 11/28/22 1215  BP: (!) 145/96  (!) 144/89   Pulse: 61 (!) 59 65 63  Resp: (!) 36 14 (!) 9 16  Temp:    98.2 F (36.8 C)  TempSrc:    Oral  SpO2: 96% 97% 97% 100%  Weight:      Height:       Physical Exam HENT:     Head: Normocephalic and atraumatic.     Mouth/Throat:     Mouth: Mucous membranes are moist.  Cardiovascular:     Rate and Rhythm: Normal rate and regular rhythm.  Pulmonary:     Effort: Pulmonary effort is normal.  Abdominal:     General: Abdomen is flat.     Palpations: Abdomen is  soft.  Musculoskeletal:        General: Normal range of motion.     Cervical back: Neck supple.  Skin:    General: Skin is warm.  Neurological:     Mental Status: He is alert. Mental status is at baseline.  Psychiatric:        Mood and Affect: Mood normal.    Data Reviewed:   Disposition: Status is: Inpatient  Planned Discharge Destination: Home with Home Health    Time spent: 35 minutes  Author: Lucienne Minks , MD 11/28/2022 12:36 PM  For on call review [email or web address].

## 2022-11-28 NOTE — Consult Note (Signed)
[facility] for Electrolyte Monitoring and Replacement   Recent Labs: Potassium (mmol/L)  Date Value  11/28/2022 3.6  12/14/2011 4.0   Magnesium (mg/dL)  Date Value  11/28/2022 1.8   Calcium (mg/dL)  Date Value  11/28/2022 8.5 (L)   Calcium, Total (mg/dL)  Date Value  12/14/2011 9.4   Albumin (g/dL)  Date Value  11/28/2022 2.3 (L)  12/14/2011 4.3   Phosphorus (mg/dL)  Date Value  11/28/2022 2.8   Sodium (mmol/L)  Date Value  11/28/2022 142  12/14/2011 136     Assessment: 67 year old male admitted with DKA on originally on an insulin infusion. Pharmacy consulted for repletion and monitoring of electrolytes.  Goal of Therapy:  Electrolytes WNL  Plan:  --no electrolyte replacement warranted for today --recheck electrolytes in am  Vallery Sa, PharmD, BCPS 11/28/2022 7:08 AM

## 2022-11-28 NOTE — Evaluation (Signed)
Physical Therapy Evaluation Patient Details Name: Alan Nunez MRN: [id number] DOB: 1955-12-08 Today's Date: 11/28/2022  History of Present Illness  Pt is a 67 y/o M admitted on 11/25/22 after presenting to the ED from home for evaluation of AMS in the setting of poorly controlled DM2 & early onset Alzheimer's. Workup is consistent with DKA & dehydration, as well as stones & pancreatitis. PMH: DM2, HTN, GERD, H. Pylori, mitral valve disorder, chronic pain of cervical spine, OSA?  Clinical Impression  Pt seen for PT evaluation with pt agreeable to tx, pt's wife present in room. Prior to admission, pt was independent without AD, living with his wife who worked during the day, denies falls. Pt's wife reports pt's current cognition is worse than baseline. Pt requires max cuing/education for safety. Pt is able to complete bed mobility without assistance, but requires up to mod assist to transfer to standing & attempt 1-2 steps. Provided pt with RW for increased stability/safety & pt able to ambulate increased distances with cuing for safe use of AD & safety. Anticipate pt will make consistent progress with functional mobility & can d/c home with wife who plans to use FMLA to stay home with him. Recommend HHPT f/u.     Recommendations for follow up therapy are one component of a multi-disciplinary discharge planning process, led by the attending physician.  Recommendations may be updated based on patient status, additional functional criteria and insurance authorization.  Follow Up Recommendations Home health PT      Assistance Recommended at Discharge Frequent or constant Supervision/Assistance  Patient can return home with the following  A little help with walking and/or transfers;A little help with bathing/dressing/bathroom;Assistance with cooking/housework;Assist for transportation;Help with stairs or ramp for entrance;Direct supervision/assist for medications management;Direct supervision/assist for  financial management    Equipment Recommendations Rolling walker (2 wheels)  Recommendations for Other Services       Functional Status Assessment Patient has had a recent decline in their functional status and demonstrates the ability to make significant improvements in function in a reasonable and predictable amount of time.     Precautions / Restrictions Precautions Precautions: Fall Restrictions Weight Bearing Restrictions: No      Mobility  Bed Mobility Overal bed mobility: Needs Assistance Bed Mobility: Supine to Sit     Supine to sit: HOB elevated, Supervision          Transfers Overall transfer level: Needs assistance Equipment used: 1 person hand held assist, None, Rolling walker (2 wheels) Transfers: Sit to/from Stand Sit to Stand: Min assist           General transfer comment: STS from EOB    Ambulation/Gait Ambulation/Gait assistance: Min assist Gait Distance (Feet): 100 Feet Assistive device: Rolling walker (2 wheels) Gait Pattern/deviations: Decreased dorsiflexion - right, Decreased step length - right, Decreased step length - left, Decreased dorsiflexion - left, Decreased stride length, Trunk flexed Gait velocity: decreased     General Gait Details: Attempted to take 1-2 steps with LUE HHA but pt unsteady & unsure of himself. Provided pt with RW & pt able to ambulate increased distances with min assist (+2 to manage IV pole). Cuing but poor demo to ambulate within base of AD  Stairs            Wheelchair Mobility    Modified Rankin (Stroke Patients Only)       Balance Overall balance assessment: Needs assistance Sitting-balance support: Feet supported, Bilateral upper extremity supported Sitting balance-Leahy Scale: Poor Sitting balance -  Comments: posterior lean when sitting EOB & attemptin to don socks on BLE   Standing balance support: No upper extremity supported, During functional activity Standing balance-Leahy Scale:  Poor                               Pertinent Vitals/Pain Pain Assessment Pain Assessment: No/denies pain    Home Living Family/patient expects to be discharged to:: Private residence Living Arrangements: Alone Available Help at Discharge: Family;Available PRN/intermittently (wife works during the day but planning to use FMLA to be home with pt) Type of Home: Mobile home Home Access: Stairs to enter Entrance Stairs-Rails: Right Entrance Stairs-Number of Steps: 2   Home Layout: One level Home Equipment: Cane - single point      Prior Function Prior Level of Function : Independent/Modified Independent             Mobility Comments: Pt was independent without AD, home alone during the day while wife worked, denies falls.       Hand Dominance        Extremity/Trunk Assessment   Upper Extremity Assessment Upper Extremity Assessment: Generalized weakness    Lower Extremity Assessment Lower Extremity Assessment: Generalized weakness       Communication   Communication: No difficulties  Cognition Arousal/Alertness: Awake/alert Behavior During Therapy: WFL for tasks assessed/performed Overall Cognitive Status: Impaired/Different from baseline Area of Impairment: Attention, Memory, Following commands, Safety/judgement, Awareness, Problem solving, Orientation                 Orientation Level: Disoriented to, Time, Situation   Memory: Decreased recall of precautions Following Commands: Follows one step commands consistently, Follows one step commands with increased time Safety/Judgement: Decreased awareness of safety, Decreased awareness of deficits Awareness: Intellectual Problem Solving: Requires verbal cues          General Comments      Exercises     Assessment/Plan    PT Assessment Patient needs continued PT services  PT Problem List Decreased strength;Decreased activity tolerance;Decreased balance;Decreased mobility;Decreased  safety awareness;Decreased knowledge of use of DME;Decreased cognition       PT Treatment Interventions DME instruction;Therapeutic exercise;Gait training;Balance training;Stair training;Neuromuscular re-education;Functional mobility training;Cognitive remediation;Therapeutic activities;Patient/family education    PT Goals (Current goals can be found in the Care Plan section)  Acute Rehab PT Goals Patient Stated Goal: get better, go home PT Goal Formulation: With patient/family Time For Goal Achievement: 12/12/22 Potential to Achieve Goals: Good    Frequency Min 2X/week     Co-evaluation               AM-PAC PT "6 Clicks" Mobility  Outcome Measure Help needed turning from your back to your side while in a flat bed without using bedrails?: None Help needed moving from lying on your back to sitting on the side of a flat bed without using bedrails?: A Little Help needed moving to and from a bed to a chair (including a wheelchair)?: A Little Help needed standing up from a chair using your arms (e.g., wheelchair or bedside chair)?: A Little Help needed to walk in hospital room?: A Little Help needed climbing 3-5 steps with a railing? : A Lot 6 Click Score: 18    End of Session   Activity Tolerance: Patient tolerated treatment well Patient left: in chair;with call bell/phone within reach;with family/visitor present;with nursing/sitter in room Nurse Communication: Mobility status      Time: 1411-1430 PT Time  Calculation (min) (ACUTE ONLY): 19 min   Charges:   PT Evaluation $PT Eval Moderate Complexity: [place], PT, DPT 11/28/22, 2:40 PM   Waunita Schooner 11/28/2022, 2:38 PM

## 2022-11-28 NOTE — Evaluation (Addendum)
Clinical/Bedside Swallow Evaluation Patient Details  Name: Alan Nunez MRN: [id number] Date of Birth: 1956-03-11  Today's Date: 11/28/2022 Time: SLP Start Time (ACUTE ONLY): 1025 SLP Stop Time (ACUTE ONLY): 1045 SLP Time Calculation (min) (ACUTE ONLY): 20 min  Past Medical History:  Past Medical History:  Diagnosis Date   Diabetes mellitus without complication ([place])    Hypertension    Past Surgical History:  Past Surgical History:  Procedure Laterality Date   HERNIA REPAIR     HPI:  Per H&P "67 year old male presenting to [facility] ED from home via EMS for evaluation of altered mental status in the setting of poorly controlled type 2 diabetes mellitus and early onset Alzheimer's.     History provided bedside by spouse.  She reports the patient has been feeling " unwell" and fatigued without any specific complaints for the last couple of days.  He has not been interested in eating or drinking normally, she believes he might not have had any nutrition over the last 2 days.  She arrived home to find him extremely altered and called EMS.  She does admit she suspects he is inconsistent with his insulin regimen during the week while she is at work.  At his baseline the patient is able to perform all his own ADLs, but has become increasingly forgetful asking questions multiple times.  While he reports to her he is taking his medication, she has noticed that his blood glucose runs high during the week and comes down on the weekends when she is giving him his injections.  She reported 1 possible vomiting episode that EMS noted in the trash can while she was at work today on 11/25/2022.  She denies any complaints from him about fever/chills, nausea/diarrhea/abdominal pain, shortness of breath/productive cough, chest pain, or urinary symptoms.  She denies any history of tobacco use, ETOH or recreational drug use."    Assessment / Plan / Recommendation  Clinical Impression  Pt seen for clinical swallowing  evaluation. Pt intially lethargic, but able to rouse of POs. Mild confusion noted. RN, Joellen Jersey, present for evaluation and assisted with repositioning.   Oral motor examination completed and significant for sparse dentition with edentulous upper arch. Pt denies wearing dentures.   Pt given trials of solid, pureed, thin liquids (via cup/straw), and ice chips. Pt with inconsistent ability to feed self due to BUE swelling/?weakness. Pt presents with s/sx mild oral dysphagia c/b prolonged mastication of solid likely due to dental status and exacerbated by mental status. Additionally, pt observed with meds whole with water and pureed. Pt with perseverative "munching" of pills. No overt s/sx pharyngeal dysphagia noted across trials. To palpation, pt with seemingly timely swallow initiation and seemingly adequate laryngeal elevation. No change to vocal quality across trials.   Recommend initiation of a mech soft diet with thin liquids with safe swallowing strategies/aspiration precautions as outlined below.   Pt is at mildly increased risk for aspiration/aspiration PNA given AMS, dental status, inconsistent ability to feed self, and medical comorbidities.   SLP to f/u per POC for diet tolerance and trials of upgraded textures as appropriate.   Pt and RN made aware of results, recommendations, and SLP POC. ?full understanding by pt. Reinforcement of content may be indicated.  SLP Visit Diagnosis: Dysphagia, oral phase (R13.11)    Aspiration Risk  Mild aspiration risk    Diet Recommendation Dysphagia 3 (Mech soft);Thin liquid   Medication Administration:  (as tolerated; x1 at a time with water vs crushed with puree)  Supervision: Staff to assist with self feeding Compensations: Minimize environmental distractions;Slow rate;Small sips/bites Postural Changes: Seated upright at 90 degrees;Remain upright for at least 30 minutes after po intake    Other  Recommendations Oral Care Recommendations: Oral care  QID;Staff/trained caregiver to provide oral care    Recommendations for follow up therapy are one component of a multi-disciplinary discharge planning process, led by the attending physician.  Recommendations may be updated based on patient status, additional functional criteria and insurance authorization.  Follow up Recommendations  (TBD)         Functional Status Assessment Patient has had a recent decline in their functional status and demonstrates the ability to make significant improvements in function in a reasonable and predictable amount of time.  Frequency and Duration min 2x/week  2 weeks       Prognosis Prognosis for Safe Diet Advancement: Fair Barriers to Reach Goals: Severity of deficits      Swallow Study   General Date of Onset: 11/25/22 HPI: Per H&P "67 year old male presenting to [facility] ED from home via EMS for evaluation of altered mental status in the setting of poorly controlled type 2 diabetes mellitus and early onset Alzheimer's.     History provided bedside by spouse.  She reports the patient has been feeling " unwell" and fatigued without any specific complaints for the last couple of days.  He has not been interested in eating or drinking normally, she believes he might not have had any nutrition over the last 2 days.  She arrived home to find him extremely altered and called EMS.  She does admit she suspects he is inconsistent with his insulin regimen during the week while she is at work.  At his baseline the patient is able to perform all his own ADLs, but has become increasingly forgetful asking questions multiple times.  While he reports to her he is taking his medication, she has noticed that his blood glucose runs high during the week and comes down on the weekends when she is giving him his injections.  She reported 1 possible vomiting episode that EMS noted in the trash can while she was at work today on 11/25/2022.  She denies any complaints from him about  fever/chills, nausea/diarrhea/abdominal pain, shortness of breath/productive cough, chest pain, or urinary symptoms.  She denies any history of tobacco use, ETOH or recreational drug use." Type of Study: Bedside Swallow Evaluation Previous Swallow Assessment: unknown Diet Prior to this Study: NPO Temperature Spikes Noted: Yes Respiratory Status: Room air History of Recent Intubation: Yes Length of Intubations (days): 3 days Date extubated: 11/27/22 Behavior/Cognition: Alert;Cooperative;Confused;Requires cueing Oral Cavity Assessment: Within Functional Limits Oral Care Completed by SLP: Recent completion by staff Oral Cavity - Dentition: Missing dentition;Poor condition (edentulous upper arch) Vision: Functional for self-feeding Self-Feeding Abilities: Needs assist;Needs set up Patient Positioning: Upright in bed Baseline Vocal Quality: Normal Volitional Cough: Strong Volitional Swallow: Able to elicit    Oral/Motor/Sensory Function Overall Oral Motor/Sensory Function: Within functional limits   Ice Chips Ice chips: Within functional limits Presentation: Spoon Other Comments: x3   Thin Liquid Thin Liquid: Within functional limits Presentation: Straw;Cup Other Comments: ~6 oz total;    Nectar Thick Nectar Thick Liquid: Not tested   Honey Thick Honey Thick Liquid: Not tested   Puree Puree: Within functional limits Presentation: Spoon;Self Fed   Solid     Solid: Impaired Oral Phase Impairments: Impaired mastication Oral Phase Functional Implications: Impaired mastication Pharyngeal Phase Impairments:  [facility])  Clearnce Sorrel Latonja Bobeck 11/28/2022,12:20 PM

## 2022-11-29 LAB — COMPREHENSIVE METABOLIC PANEL
ALT: 36 U/L (ref 0–44)
AST: 58 U/L — ABNORMAL HIGH (ref 15–41)
Albumin: 2.5 g/dL — ABNORMAL LOW (ref 3.5–5.0)
Alkaline Phosphatase: 166 U/L — ABNORMAL HIGH (ref 38–126)
Anion gap: 10 (ref 5–15)
BUN: 14 mg/dL (ref 8–23)
CO2: 26 mmol/L (ref 22–32)
Calcium: 8.7 mg/dL — ABNORMAL LOW (ref 8.9–10.3)
Chloride: 106 mmol/L (ref 98–111)
Creatinine, Ser: 1.1 mg/dL (ref 0.61–1.24)
GFR, Estimated: >60 mL/min (ref >60)
Glucose, Bld: 108 mg/dL — ABNORMAL HIGH (ref 70–99)
Potassium: 3.4 mmol/L — ABNORMAL LOW (ref 3.5–5.1)
Sodium: 142 mmol/L (ref 135–145)
Total Bilirubin: 0.6 mg/dL (ref 0.3–1.2)
Total Protein: 5 g/dL — ABNORMAL LOW (ref 6.5–8.1)

## 2022-11-29 LAB — CBC
HCT: 31.1 % — ABNORMAL LOW (ref 39.0–52.0)
Hemoglobin: 10.1 g/dL — ABNORMAL LOW (ref 13.0–17.0)
MCH: 30.3 pg (ref 26.0–34.0)
MCHC: 32.5 g/dL (ref 30.0–36.0)
MCV: 93.4 fL (ref 80.0–100.0)
Platelets: 120 10*3/uL — ABNORMAL LOW (ref 150–400)
RBC: 3.33 MIL/uL — ABNORMAL LOW (ref 4.22–5.81)
RDW: 13.7 % (ref 11.5–15.5)
WBC: 6.1 10*3/uL (ref 4.0–10.5)
nRBC: 0 % (ref 0.0–0.2)

## 2022-11-29 LAB — CULTURE, RESPIRATORY W GRAM STAIN

## 2022-11-29 LAB — MAGNESIUM: Magnesium: 2 mg/dL (ref 1.7–2.4)

## 2022-11-29 LAB — HEMOGLOBIN A1C
Hgb A1c MFr Bld: 12.7 % — ABNORMAL HIGH (ref 4.8–5.6)
Mean Plasma Glucose: 317.79 mg/dL

## 2022-11-29 LAB — GLUCOSE, CAPILLARY
Glucose-Capillary: 103 mg/dL — ABNORMAL HIGH (ref 70–99)
Glucose-Capillary: 170 mg/dL — ABNORMAL HIGH (ref 70–99)
Glucose-Capillary: 206 mg/dL — ABNORMAL HIGH (ref 70–99)
Glucose-Capillary: 243 mg/dL — ABNORMAL HIGH (ref 70–99)
Glucose-Capillary: 279 mg/dL — ABNORMAL HIGH (ref 70–99)
Glucose-Capillary: 299 mg/dL — ABNORMAL HIGH (ref 70–99)
Glucose-Capillary: 98 mg/dL (ref 70–99)

## 2022-11-29 LAB — LACTIC ACID, PLASMA: Lactic Acid, Venous: 1.2 mmol/L (ref 0.5–1.9)

## 2022-11-29 LAB — PHOSPHORUS: Phosphorus: 2.1 mg/dL — ABNORMAL LOW (ref 2.5–4.6)

## 2022-11-29 LAB — C-REACTIVE PROTEIN: CRP: 4.8 mg/dL — ABNORMAL HIGH (ref <1.0)

## 2022-11-29 MED ORDER — DOCUSATE SODIUM 100 MG PO CAPS
100.0000 mg | ORAL_CAPSULE | Freq: Two times a day (BID) | ORAL | Status: DC
  Administered 2022-11-30: 100 mg via ORAL
  Filled 2022-11-29 (×2): qty 1

## 2022-11-29 MED ORDER — LORAZEPAM 2 MG/ML IJ SOLN
1.0000 mg | Freq: Once | INTRAMUSCULAR | Status: AC
  Administered 2022-11-29: 1 mg via INTRAVENOUS
  Filled 2022-11-29: qty 1

## 2022-11-29 MED ORDER — POLYETHYLENE GLYCOL 3350 17 G PO PACK
17.0000 g | PACK | Freq: Every day | ORAL | Status: DC
  Filled 2022-11-29: qty 1

## 2022-11-29 MED ORDER — ENOXAPARIN SODIUM 40 MG/0.4ML IJ SOSY
40.0000 mg | PREFILLED_SYRINGE | Freq: Every day | INTRAMUSCULAR | Status: DC
  Filled 2022-11-29: qty 0.4

## 2022-11-29 MED ORDER — DONEPEZIL HCL 5 MG PO TABS
5.0000 mg | ORAL_TABLET | Freq: Every day | ORAL | Status: DC
  Administered 2022-11-30: 5 mg via ORAL
  Filled 2022-11-29: qty 1

## 2022-11-29 MED ORDER — DIPHENHYDRAMINE HCL 25 MG PO CAPS: 25.0000 mg | ORAL_CAPSULE | Freq: Every evening | ORAL | Status: DC | PRN

## 2022-11-29 MED ORDER — DIPHENHYDRAMINE HCL 50 MG/ML IJ SOLN
25.0000 mg | Freq: Once | INTRAMUSCULAR | Status: AC
  Administered 2022-11-29: 25 mg via INTRAVENOUS
  Filled 2022-11-29: qty 1

## 2022-11-29 MED ORDER — HALOPERIDOL LACTATE 5 MG/ML IJ SOLN
2.0000 mg | Freq: Once | INTRAMUSCULAR | Status: AC
  Administered 2022-11-29: 2 mg via INTRAVENOUS
  Filled 2022-11-29: qty 1

## 2022-11-29 MED ORDER — GLUCERNA SHAKE PO LIQD
237.0000 mL | Freq: Three times a day (TID) | ORAL | Status: DC
  Administered 2022-11-29 – 2022-11-30 (×2): 237 mL via ORAL

## 2022-11-29 MED ORDER — HALOPERIDOL 0.5 MG PO TABS
2.0000 mg | ORAL_TABLET | Freq: Three times a day (TID) | ORAL | Status: DC | PRN
  Filled 2022-11-29 (×2): qty 4

## 2022-11-29 MED ORDER — PANTOPRAZOLE SODIUM 40 MG PO TBEC
40.0000 mg | DELAYED_RELEASE_TABLET | Freq: Every day | ORAL | Status: DC
  Administered 2022-11-30: 40 mg via ORAL
  Filled 2022-11-29: qty 1

## 2022-11-29 MED ORDER — ADULT MULTIVITAMIN W/MINERALS CH
1.0000 | ORAL_TABLET | Freq: Every day | ORAL | Status: DC
  Administered 2022-11-29 – 2022-11-30 (×2): 1 via ORAL
  Filled 2022-11-29 (×2): qty 1

## 2022-11-29 MED ORDER — HALOPERIDOL 0.5 MG PO TABS
2.0000 mg | ORAL_TABLET | Freq: Once | ORAL | Status: AC
  Administered 2022-11-29: 2 mg via ORAL
  Filled 2022-11-29: qty 4

## 2022-11-29 MED ORDER — POTASSIUM CHLORIDE CRYS ER 20 MEQ PO TBCR
40.0000 meq | EXTENDED_RELEASE_TABLET | Freq: Once | ORAL | Status: AC
  Administered 2022-11-29: 40 meq via ORAL
  Filled 2022-11-29: qty 2

## 2022-11-29 MED ORDER — POTASSIUM PHOSPHATES 15 MMOLE/5ML IV SOLN
15.0000 mmol | Freq: Once | INTRAVENOUS | Status: AC
  Administered 2022-11-29: 15 mmol via INTRAVENOUS
  Filled 2022-11-29: qty 5

## 2022-11-29 MED ORDER — QUETIAPINE FUMARATE 25 MG PO TABS
25.0000 mg | ORAL_TABLET | Freq: Once | ORAL | Status: AC
  Administered 2022-11-29: 25 mg via ORAL
  Filled 2022-11-29: qty 1

## 2022-11-29 NOTE — Consult Note (Signed)
[facility] for Electrolyte Monitoring and Replacement   Recent Labs: Potassium (mmol/L)  Date Value  11/29/2022 3.4 (L)  12/14/2011 4.0   Magnesium (mg/dL)  Date Value  11/29/2022 2.0   Calcium (mg/dL)  Date Value  11/29/2022 8.7 (L)   Calcium, Total (mg/dL)  Date Value  12/14/2011 9.4   Albumin (g/dL)  Date Value  11/29/2022 2.5 (L)  12/14/2011 4.3   Phosphorus (mg/dL)  Date Value  11/29/2022 2.1 (L)   Sodium (mmol/L)  Date Value  11/29/2022 142  12/14/2011 136   Assessment: 67 year old male admitted with DKA on originally on an insulin infusion. Pharmacy consulted for repletion and monitoring of electrolytes.  Goal of Therapy:  Electrolytes within normal limits  Plan:  --Patient care transferred from PCCM to [facility]. Will discontinue electrolyte consult at this time. Defer further ordering of labs and electrolyte replacement to primary team  Benita Gutter  11/29/2022 7:24 AM

## 2022-11-29 NOTE — Progress Notes (Signed)
Subjective:  CC: Alan Nunez is a 67 y.o. male  Hospital stay day 4,   DKA, possible gallstone pancreatitis  HPI: No acute issues reported overnight.  Patient has no complaints.  ROS:  General: Denies weight loss, weight gain, fatigue, fevers, chills, and night sweats. Heart: Denies chest pain, palpitations, racing heart, irregular heartbeat, leg pain or swelling, and decreased activity tolerance. Respiratory: Denies breathing difficulty, shortness of breath, wheezing, cough, and sputum. GI: Denies change in appetite, heartburn, nausea, vomiting, constipation, diarrhea, and blood in stool. GU: Denies difficulty urinating, pain with urinating, urgency, frequency, blood in urine.   Objective:   Temp:  [98 F (36.7 C)-98.7 F (37.1 C)] 98.4 F (36.9 C) (01/29 0741) Pulse Rate:  [59-90] 70 (01/29 0741) Resp:  [9-18] 18 (01/29 0741) BP: (128-164)/(82-97) 143/82 (01/29 0741) SpO2:  [95 %-100 %] 97 % (01/29 0741) Weight:  [72.8 kg] 72.8 kg (01/29 0310)     Height: 5\' 10"  (177.8 cm) Weight: 72.8 kg BMI (Calculated): 23.03   Intake/Output this shift:   Intake/Output Summary (Last 24 hours) at 11/29/2022 1037 Last data filed at 11/29/2022 5462 Gross per 24 hour  Intake 1949.53 ml  Output 7500 ml  Net -5550.47 ml    Constitutional :  alert, cooperative, appears stated age, and no distress  Respiratory:  clear to auscultation bilaterally  Cardiovascular:  regular rate and rhythm  Gastrointestinal: soft, non-tender; bowel sounds normal; no masses,  no organomegaly.   Skin: Cool and moist.   Psychiatric: Normal affect, non-agitated, not confused       LABS:     Latest Ref Rng & Units 11/29/2022    3:53 AM 11/28/2022    4:43 AM 11/28/2022    4:42 AM  CMP  Glucose 70 - 99 mg/dL 108  136  133   BUN 8 - 23 mg/dL 14  22  22    Creatinine 0.61 - 1.24 mg/dL 1.10  1.28  1.34   Sodium 135 - 145 mmol/L 142  142  141   Potassium 3.5 - 5.1 mmol/L 3.4  3.6  3.7   Chloride 98 - 111 mmol/L  106  107  106   CO2 22 - 32 mmol/L 26  28  28    Calcium 8.9 - 10.3 mg/dL 8.7  8.5  8.4   Total Protein 6.5 - 8.1 g/dL 5.0     Total Bilirubin 0.3 - 1.2 mg/dL 0.6     Alkaline Phos 38 - 126 U/L 166     AST 15 - 41 U/L 58     ALT 0 - 44 U/L 36         Latest Ref Rng & Units 11/29/2022    3:53 AM 11/28/2022    4:42 AM 11/27/2022    4:26 AM  CBC  WBC 4.0 - 10.5 K/uL 6.1  6.5  6.2   Hemoglobin 13.0 - 17.0 g/dL 10.1  10.2  9.7   Hematocrit 39.0 - 52.0 % 31.1  31.8  28.5   Platelets 150 - 400 K/uL 120  119  135     RADS: N/a Assessment:      Cholelithiasis and interstitial pancreatitis in the setting of DKA. Elevated triglycerides to the mid 200s noted as well but that has resolved now.  Doing well postextubation and transfer out of ICU.  Continue current management with hospitalist.  Discussed with patient need for outpatient discussion of interval cholecystectomy.  Patient can follow-up in the office 1 week following discharge.  Surgery will peripherally follow for now.  Please call with any additional questions or concerns.  labs/images/medications/previous chart entries reviewed personally and relevant changes/updates noted above.

## 2022-11-29 NOTE — Evaluation (Signed)
Occupational Therapy Evaluation Patient Details Name: Alan Nunez MRN: [id number] DOB: Oct 09, 1956 Today's Date: 11/29/2022   History of Present Illness Pt is a 67 y/o M admitted on 11/25/22 after presenting to the ED from home for evaluation of AMS in the setting of poorly controlled DM2 & early onset Alzheimer's. Workup is consistent with DKA & dehydration, as well as stones & pancreatitis. PMH: DM2, HTN, GERD, H. Pylori, mitral valve disorder, chronic pain of cervical spine, OSA?   Clinical Impression   Patient received for OT evaluation. See flowsheet below for details of function. Generally, patient requiring no assistance for bed mobility, CGA with RW for functional mobility, and supervision for ADLs. Discussed d/c recommendations with pt and wife (use of shower chair, participation in [place] for balance improvement, supervision for safety with ADLs); pt and wife acknowledged.  Patient with no further need for OT in acute care; discharge OT services.        Recommendations for follow up therapy are one component of a multi-disciplinary discharge planning process, led by the attending physician.  Recommendations may be updated based on patient status, additional functional criteria and insurance authorization.   Follow Up Recommendations  No OT follow up     Assistance Recommended at Discharge Frequent or constant Supervision/Assistance  Patient can return home with the following A little help with walking and/or transfers;A little help with bathing/dressing/bathroom;Assistance with cooking/housework;Direct supervision/assist for medications management;Direct supervision/assist for financial management;Assist for transportation;Help with stairs or ramp for entrance    Functional Status Assessment  Patient has had a recent decline in their functional status and demonstrates the ability to make significant improvements in function in a reasonable and predictable amount of time.  Equipment  Recommendations  None recommended by OT (Use the shower chair he already has at home)    Recommendations for Other Services       Precautions / Restrictions Precautions Precautions: Fall Restrictions Weight Bearing Restrictions: No      Mobility Bed Mobility Overal bed mobility: Independent Bed Mobility: Supine to Sit     Supine to sit: Independent     General bed mobility comments: Pt moving well today.    Transfers Overall transfer level: Needs assistance Equipment used: Rolling walker (2 wheels) Transfers: Sit to/from Stand Sit to Stand: Min guard           General transfer comment: gait belt used for safety; RW; hand placement on bed/RW with cues.      Balance Overall balance assessment: Needs assistance Sitting-balance support: Feet supported, Bilateral upper extremity supported Sitting balance-Leahy Scale: Good     Standing balance support: Bilateral upper extremity supported Standing balance-Leahy Scale: Fair Standing balance comment: Pt able to walk with RW CGA. Able to stand at sink supervision for washing hands today.                           ADL either performed or assessed with clinical judgement   ADL Overall ADL's : Needs assistance/impaired     Grooming: Supervision/safety;Wash/dry hands Grooming Details (indicate cue type and reason): Pt able to wash hands standing at sink today; reached outside of base of support for obtaining soap. No loss of balance.                               General ADL Comments: Recommend pt use shower chair for seated ADLs due to decreased  balance. Recommend pt participate in HHPT for balance and use of RW at home. Pt able to demonstrate figure four position from seated, indicating ability to participate safely in [place] ADLs. Recommend wife supervision for safety 2/2 cognitive deficits.     Vision Baseline Vision/History: 0 No visual deficits       Perception     Praxis       Pertinent Vitals/Pain Pain Assessment Pain Assessment: No/denies pain     Hand Dominance     Extremity/Trunk Assessment Upper Extremity Assessment Upper Extremity Assessment: Overall WFL for tasks assessed   Lower Extremity Assessment Lower Extremity Assessment: Defer to PT evaluation       Communication Communication Communication: No difficulties   Cognition Arousal/Alertness: Awake/alert Behavior During Therapy: WFL for tasks assessed/performed Overall Cognitive Status: History of cognitive impairments - at baseline Area of Impairment: Orientation, Memory                 Orientation Level: Disoriented to, Time, Situation   Memory: Decreased recall of precautions Following Commands: Follows one step commands consistently Safety/Judgement: Decreased awareness of safety, Decreased awareness of deficits     General Comments: Pt is very pleasant and agreeable. Follows cues. Able to state wife's name and discuss dog accurately (wife corroborates). Hx of dementia in medical record.     General Comments       Exercises     Shoulder Instructions      Home Living Family/patient expects to be discharged to:: Private residence Living Arrangements: Spouse/significant other Available Help at Discharge: Family (wife is taking FMLA to be with patient when he d/c home. Typically she works during the day.) Type of Home: Mobile home Home Access: Stairs to enter [employer] of Steps: 2 Entrance Stairs-Rails: Right Home Layout: One level     Bathroom Shower/Tub: Chief Strategy Officer: Standard     Home Equipment: Cane - single point;Shower seat          Prior Functioning/Environment Prior Level of Function : Independent/Modified Independent             Mobility Comments: Pt was independent without AD, home alone during the day while wife worked, denies falls. ADLs Comments: (I) ADLs prior to admission. However, wife suspects pt may  not be taking medications properly while she is away. Wife does Landscape architect and driving; most household IADLs.        OT Problem List:        OT Treatment/Interventions:      OT Goals(Current goals can be found in the care plan section) Acute Rehab OT Goals Patient Stated Goal: Go home OT Goal Formulation: With patient  OT Frequency:      Co-evaluation              AM-PAC OT "6 Clicks" Daily Activity     Outcome Measure Help from another person eating meals?: None Help from another person taking care of personal grooming?: None Help from another person toileting, which includes using toliet, bedpan, or urinal?: A Little Help from another person bathing (including washing, rinsing, drying)?: A Little Help from another person to put on and taking off regular upper body clothing?: A Little Help from another person to put on and taking off regular lower body clothing?: A Little 6 Click Score: 20   End of Session Equipment Utilized During Treatment: Rolling walker (2 wheels);Gait belt;Other (comment) (Second person to assist with management of IV during mobility so that OT could  guard patient for safety. Catheter bag hooked on RW during sesison.) Nurse Communication: Mobility status  Activity Tolerance: Patient tolerated treatment well Patient left: Other (comment);with bed alarm set;with family/visitor present (sitting EOB with breakfast tray set up in front of him.)  OT Visit Diagnosis: Unsteadiness on feet (R26.81)                Time: 6213-0865 OT Time Calculation (min): 20 min Charges:  OT General Charges $OT Visit: 1 Visit OT Evaluation $OT Eval Moderate Complexity: 1 Mod  [place], MS, OTR/L   Alan Nunez 11/29/2022, 9:39 AM

## 2022-11-29 NOTE — Care Management Important Message (Signed)
Important Message  Patient Details  Name: Alan Nunez MRN: [id number] Date of Birth: 03/07/1956   Medicare Important Message Given:  Yes     Juliann Pulse A Hazeline Charnley 11/29/2022, 2:35 PM

## 2022-11-29 NOTE — TOC Initial Note (Signed)
Transition of Care [facility]) - Initial/Assessment Note    Patient Details  Name: CLEOFAS HUDGINS MRN: [id number] Date of Birth: Nov 25, 1955  [facility] CM/SW Contact:    Gerilyn Pilgrim, LCSW Phone Number: 11/29/2022, 1:16 PM  Clinical Narrative:    SW spoke with wife regarding care at home. Wife reports she would like [place] arranged, PT/RN. Wife does not have a preference for agency. Pt does not use any assistive devices at home and is recommended to have a RW which wife is agreeable for [place] ordering. Pt was seeing Dr. Wynetta Emery but states he has an appt with a new provider at [facility] who's last name starts with a K. Wife reports that she would like [facility] [address]. CSW will work to coordinate [facility].  1:19pm Merleen Nicely from [place] agreed to take pt for [facility] PT/RN. CSW to contact [employer] for RW closer to discharge.                 Expected Discharge Plan: [place] Barriers to Discharge: Continued Medical Work up   Patient Goals and CMS Choice Patient states their goals for this hospitalization and ongoing recovery are:: return home with [facility]   Choice offered to / list presented to : Spouse      Expected Discharge Plan and Services       Living arrangements for the past 2 months: Single Family Home                   DME Agency: [employer]       HH Arranged: RN, PT [place] Agency: [facility] [place] Date [facility] Agency Contacted: 11/29/22 Time [place]: 0100 Representative spoke with at [place]: Merleen Nicely  Prior Living Arrangements/Services Living arrangements for the past 2 months: [place] Lives with:: Spouse Patient language and need for interpreter reviewed:: Yes        Need for Family Participation in Patient Care: Yes (Comment) Care giver support system in place?: Yes (comment) Current home services: DME, Home PT, Home RN Criminal Activity/Legal Involvement Pertinent to Current  Situation/Hospitalization: No - Comment as needed  Activities of Daily Living      Permission Sought/Granted            Permission granted to share info w Relationship: [place] and DME company     Emotional Assessment       Orientation: : Oriented to Self      Admission diagnosis:  DKA (diabetic ketoacidosis) ([place]) [E11.10] Type 1 diabetes mellitus with ketoacidotic coma ([place]) [E10.11] Patient Active Problem List   Diagnosis Date Noted   DKA (diabetic ketoacidosis) ([place]) 11/25/2022   PCP:  Patient, No Pcp Per Pharmacy:   [id number] [place] [place] - [address] [place] [address] Phone: [phone] Fax: [phone]     Social Determinants of Health (SDOH) Social History: SDOH Screenings   Tobacco Use: Low Risk  (11/25/2022)   SDOH Interventions:     Readmission Risk Interventions     No data to display

## 2022-11-29 NOTE — Progress Notes (Signed)
Nutrition Follow-up  DOCUMENTATION CODES:   Not applicable  INTERVENTION:   -Glucerna Shake po TID, each supplement provides 220 kcal and 10 grams of protein  -MVI with minerals daily  NUTRITION DIAGNOSIS:   Inadequate oral intake related to inability to eat as evidenced by NPO status.  Progressing; advanced to PO diet on 11/28/22  GOAL:   Patient will meet greater than or equal to 90% of their needs  Progressing   MONITOR:   PO intake, Supplement acceptance  REASON FOR ASSESSMENT:   Ventilator    ASSESSMENT:   Pt admitted for evaluation of altered mental status in the setting of poorly controlled type 2 diabetes mellitus and early onset Alzheimer's.  1/27- extubated 1/28- s/p BSE- advanced to dysphagia 3 diet with thin liquids  Reviewed I/O's: -5.4 L x 24 hours and +3.4 L since admission  UOP: 7.8 L x 24 hours  Per general surgery notes, plan to discuss cholecystectomy after discharge.   Pt resting in recliner chair at time of visit. Due to dementia, unable to provide history.   Pt is currently on a dysphagia 3 diet. No intake data available to assess at this time.   Plan to discharge home once medically stable.   Medications reviewed and include colace and miralax.   Labs reviewed: K: 3.4,  Phos: 2.1, CBGS: 98-279 (inpatient orders for glycemic control are 0-9 units insulin aspart every 4 hours and 10 units insulin glargine-yfgn BID).    Diet Order:   Diet Order             DIET DYS 3 Room service appropriate? Yes; Fluid consistency: Thin  Diet effective now                   EDUCATION NEEDS:   Education needs have been addressed  Skin:  Skin Assessment: Reviewed RN Assessment  Last BM:  11/28/22  Height:   Ht Readings from Last 1 Encounters:  11/25/22 5\' 10"  (1.778 m)    Weight:   Wt Readings from Last 1 Encounters:  11/29/22 72.8 kg    Ideal Body Weight:  75.5 kg  BMI:  Body mass index is 23.03 kg/m.  Estimated Nutritional  Needs:   Kcal:  1850-2050  Protein:  90-105 grams  Fluid:  > 1.8 L    Loistine Chance, RD, LDN, [place] Registered Dietitian II Certified Diabetes Care and Education Specialist Please refer to [facility] for RD and/or RD on-call/weekend/after hours pager

## 2022-11-29 NOTE — Progress Notes (Signed)
CROSS COVER NOTE  NAME: Alan Nunez MRN: [id number] DOB : June 26, 1956 ATTENDING PHYSICIAN: Lucienne Minks, MD    Date of Service   11/29/2022   HPI/Events of Note   Pt continually exiting bed, pulling/removing lines, combative, requires constant attention. Please provide order for Safety Sitter.   He has a "safety rounder" RN reports patient is refusing to take PO meds at this time   At bedside [name] is alert and oriented to person place and situation, disoriented to time. Pleasant and converses with me, no complaints at this time   Impulsive, goal directed to get to bathroom. Agitated when staff attempt to redirect him/prevent him from getting physically to bathroom.  Interventions   Assessment/Plan:  Trial Oncologist - never brought to room; Order placed for safety sitter IV Haldol 2 mg x1 IV Benadryl 25 mg Mitts Bedside commode      To reach the provider On-Call:   7AM- 7PM see care teams to locate the attending and reach out to them via [email or web address] 7PM-7AM contact night-coverage If you still have difficulty reaching the appropriate provider, please page the [facility] (Director on Call) for [facility] on amion for assistance  This document was prepared using Systems analyst and may include unintentional dictation errors.  Neomia Glass DNP, MBA, FNP-BC, PMHNP-BC Nurse Practitioner [facility] Pager [phone]

## 2022-11-29 NOTE — Progress Notes (Signed)
Speech Language Pathology Treatment:    Patient Details Name: Alan Nunez MRN: [id number] DOB: 08-13-1956 Today's Date: 11/29/2022 Time: 1208-1223 SLP Time Calculation (min) (ACUTE ONLY): 15 min  Assessment / Plan / Recommendation Clinical Impression  Pt seen for diet tolerance. Pt upright in bed. Wife at bedside. Cleared with RN who noted pt without difficulty consuming breakfast. Pt took pills whole with applesauce per RN report.   Per chart review, WBC and temp WNL. No recent chest imaging.  Pt observed consuming soft solid and thin liquids. Pt demonstrated a grossly functional oral swallow c/b mildly prolonged mastication which is likely due to dental status. Pharyngeal swallow appeared [facility]. No overt s/sx pharyngeal dysphagia. Timely swallow initiation and seemingly adequate laryngeal elevation. No change to vocal quality across trials.   Recommend continuation of a mech soft diet with thin liquids with safe swallowing strategies/aspiration precautions as outlined below.   SLP to sign off as pt has no acute SLP needs at this time.   Pt and wife made aware of results, recommendations, and SLP POC. Both verbalized understanding/agreement.   HPI HPI: Per H&P "67 year old male presenting to [facility] ED from home via EMS for evaluation of altered mental status in the setting of poorly controlled type 2 diabetes mellitus and early onset Alzheimer's.     History provided bedside by spouse.  She reports the patient has been feeling " unwell" and fatigued without any specific complaints for the last couple of days.  He has not been interested in eating or drinking normally, she believes he might not have had any nutrition over the last 2 days.  She arrived home to find him extremely altered and called EMS.  She does admit she suspects he is inconsistent with his insulin regimen during the week while she is at work.  At his baseline the patient is able to perform all his own ADLs, but has become  increasingly forgetful asking questions multiple times.  While he reports to her he is taking his medication, she has noticed that his blood glucose runs high during the week and comes down on the weekends when she is giving him his injections.  She reported 1 possible vomiting episode that EMS noted in the trash can while she was at work today on 11/25/2022.  She denies any complaints from him about fever/chills, nausea/diarrhea/abdominal pain, shortness of breath/productive cough, chest pain, or urinary symptoms.  She denies any history of tobacco use, ETOH or recreational drug use."      SLP Plan  All goals met      Recommendations for follow up therapy are one component of a multi-disciplinary discharge planning process, led by the attending physician.  Recommendations may be updated based on patient status, additional functional criteria and insurance authorization.    Recommendations  Diet recommendations: Dysphagia 3 (mechanical soft);Thin liquid Medication Administration:  (as tolerated) Supervision: Patient able to self feed Compensations: Minimize environmental distractions;Slow rate;Small sips/bites Postural Changes and/or Swallow Maneuvers: Out of bed for meals;Seated upright 90 degrees;Upright 30-60 min after meal                Oral Care Recommendations: Oral care QID;Staff/trained caregiver to provide oral care Follow Up Recommendations: No SLP follow up Assistance recommended at discharge:  (defer to PT/OT) SLP Visit Diagnosis: Dysphagia, oral phase (R13.11) Plan: All goals met          Cherrie Gauze, M.S., [facility] [phone] ([place])  Quintella Baton  11/29/2022, 12:51 PM

## 2022-11-29 NOTE — Progress Notes (Signed)
  Progress Note   Patient: Alan Nunez ZOX:[id number] DOB: [birth year] DOA: 11/25/2022     4 DOS: the patient was seen and examined on 11/29/2022   Brief hospital course:  Assessment and Plan:  Severe diabetic ketoacidosis  - Resolved  - Check A1c - Novolog SS - Semglee 10 units sq bid    Acute Respiratory Failure secondary to acute encephalopathy with aspiration in the setting of DKA - Resolved  - Proventil q4 hr PRN  - SLP has advised Diet DYS 3    Severe Sepsis in setting of Aspiration, Acute Pancreatitis, & Ascending colitis - IV zosyn 3.375 g q8hr    Mildly elevated troponin secondary to NSTEMI versus demand ischemia - ECHO LVEF 60-65% with no wall motion abnormalities  - Monitor    Acute interstitial pancreatitis without necrotizing features or abscess, suspect due to cholelithiasis - IV antibx as above     Acute Kidney Injury secondary to hypovolemia and dehydration in the setting of DKA  - Resolved    Query GIB - Hgb stable  - IV protonix 40 mg q12    Acute encephalopathy suspect metabolic - Resolved  - Aricept 5 mg daily    HTN - Nifedipine 24hr 30 mg PO daily    DVT prophylaxis: Heparin 5000 units sq q8hr       Subjective: Pt seen and examined at the bedside. IV fluids stopped as AKI has resolved. Continue IV antibx's. Foley removed today. Continue PT/OT today.   Outpt appt obtained with Dr. Wynetta Emery PCP [phone] for Thurs Dec 02 2022 at 1145 am.   Physical Exam: Vitals:   11/29/22 0049 11/29/22 0310 11/29/22 0436 11/29/22 0741  BP: (!) 143/84  128/82 (!) 143/82  Pulse: 90  70 70  Resp: 16  16 18   Temp: 98.5 F (36.9 C)  98 F (36.7 C) 98.4 F (36.9 C)  TempSrc:      SpO2: 95%  96% 97%  Weight:  72.8 kg    Height:        HENT:     Head: Normocephalic and atraumatic.     Mouth/Throat:     Mouth: Mucous membranes are moist.  Cardiovascular:     Rate and Rhythm: Normal rate and regular rhythm.  Pulmonary:     Effort: Pulmonary effort  is normal.  Abdominal:     General: Abdomen is flat.     Palpations: Abdomen is soft.  Musculoskeletal:        General: Normal range of motion.     Cervical back: Neck supple.  Skin:    General: Skin is warm.  Neurological:     Mental Status: He is alert. Mental status is at baseline.  Psychiatric:        Mood and Affect: Mood normal.   Data Reviewed:   Disposition: Status is: Inpatient  Planned Discharge Destination: Home with Home Health    Time spent: 35 minutes  Author: Lucienne Minks , MD 11/29/2022 8:37 AM  For on call review [email or web address].

## 2022-11-30 LAB — GLUCOSE, CAPILLARY
Glucose-Capillary: 173 mg/dL — ABNORMAL HIGH (ref 70–99)
Glucose-Capillary: 122 mg/dL — ABNORMAL HIGH (ref 70–99)

## 2022-11-30 LAB — MAGNESIUM: Magnesium: 2.2 mg/dL (ref 1.7–2.4)

## 2022-11-30 LAB — COMPREHENSIVE METABOLIC PANEL
ALT: 37 U/L (ref 0–44)
AST: 45 U/L — ABNORMAL HIGH (ref 15–41)
Albumin: 2.9 g/dL — ABNORMAL LOW (ref 3.5–5.0)
Alkaline Phosphatase: 202 U/L — ABNORMAL HIGH (ref 38–126)
Anion gap: 4 — ABNORMAL LOW (ref 5–15)
BUN: 12 mg/dL (ref 8–23)
CO2: 27 mmol/L (ref 22–32)
Calcium: 8.5 mg/dL — ABNORMAL LOW (ref 8.9–10.3)
Chloride: 104 mmol/L (ref 98–111)
Creatinine, Ser: 1.21 mg/dL (ref 0.61–1.24)
GFR, Estimated: >60 mL/min (ref >60)
Glucose, Bld: 197 mg/dL — ABNORMAL HIGH (ref 70–99)
Potassium: 3.7 mmol/L (ref 3.5–5.1)
Sodium: 135 mmol/L (ref 135–145)
Total Bilirubin: 0.7 mg/dL (ref 0.3–1.2)
Total Protein: 5.4 g/dL — ABNORMAL LOW (ref 6.5–8.1)

## 2022-11-30 LAB — CBC
HCT: 31.1 % — ABNORMAL LOW (ref 39.0–52.0)
Hemoglobin: 10.1 g/dL — ABNORMAL LOW (ref 13.0–17.0)
MCH: 30.3 pg (ref 26.0–34.0)
MCHC: 32.5 g/dL (ref 30.0–36.0)
MCV: 93.4 fL (ref 80.0–100.0)
Platelets: 123 10*3/uL — ABNORMAL LOW (ref 150–400)
RBC: 3.33 MIL/uL — ABNORMAL LOW (ref 4.22–5.81)
RDW: 13.7 % (ref 11.5–15.5)
WBC: 5.3 10*3/uL (ref 4.0–10.5)
nRBC: 0 % (ref 0.0–0.2)

## 2022-11-30 LAB — C-REACTIVE PROTEIN: CRP: 2.5 mg/dL — ABNORMAL HIGH (ref <1.0)

## 2022-11-30 LAB — PHOSPHORUS: Phosphorus: 2.6 mg/dL (ref 2.5–4.6)

## 2022-11-30 LAB — AMMONIA: Ammonia: 14 umol/L (ref 9–35)

## 2022-11-30 MED ORDER — INSULIN DEGLUDEC 100 UNIT/ML ~~LOC~~ SOPN: 10.0000 [IU] | PEN_INJECTOR | Freq: Two times a day (BID) | SUBCUTANEOUS | 0 refills | Status: DC

## 2022-11-30 MED ORDER — AMOXICILLIN-POT CLAVULANATE 875-125 MG PO TABS: 1.0000 | ORAL_TABLET | Freq: Two times a day (BID) | ORAL | 0 refills | Status: AC

## 2022-11-30 MED ORDER — INSULIN ASPART 100 UNIT/ML FLEXPEN: 4.0000 [IU] | PEN_INJECTOR | Freq: Three times a day (TID) | SUBCUTANEOUS | 0 refills | Status: DC

## 2022-11-30 NOTE — Discharge Summary (Signed)
Physician Discharge Summary   Patient: Alan Nunez MRN: [id number] DOB: 02-11-1956  Admit date:     11/25/2022  Discharge date: 11/30/22  Discharge Physician: Lucienne Minks    PCP: Patient, No Pcp Per   Recommendations at discharge:    Please complete the course of oral antibx's at the facility   Discharge Diagnoses: Principal Problem:   DKA (diabetic ketoacidosis) ([place])  Resolved Problems:   * No resolved hospital problems. *  Hospital Course: 67 yo M admitted to the ICU with severe DKA and anion gap metabolic acidosis. He required intubation and mechanical ventilation. While intubated he also required IV pressor support. With the IV insulin his anion gap closed and he was successfully extubated. He was also noted ot have sepsis secondary to pancreatitis and ascending colitis. His AKI improved with IV fluid resuscitation. Surgery was consulted.  Surgery advised that they would like to see the pt outpt for discussion of an interval cholecystectomy. He should follow up in 1 week post discharge.  Pt will go home with PO augmentin to complete the course at home.    He must take his insulin at home (A1c on this admission was noted to be 12.7%). If he is not compliant with his insulin he is at extremely high risk for re-admission to the hospital.   Assessment and Plan: No notes have been filed under this hospital service. Service: Hospitalist       Consultants: Surgery  Disposition: Home Diet recommendation:  Carb modified diet DISCHARGE MEDICATION: Allergies as of 11/30/2022       Reactions   Codeine Hives   Morphine And Related Hives        Medication List     STOP taking these medications    acetaminophen 650 MG CR tablet Commonly known as: TYLENOL   insulin aspart 100 UNIT/ML injection Commonly known as: novoLOG Replaced by: insulin aspart 100 UNIT/ML FlexPen   metFORMIN 1000 MG tablet Commonly known as: GLUCOPHAGE       TAKE these medications     amoxicillin-clavulanate 875-125 MG tablet Commonly known as: AUGMENTIN Take 1 tablet by mouth 2 (two) times daily for 3 days.   donepezil 5 MG tablet Commonly known as: ARICEPT Take 5 mg by mouth daily.   insulin aspart 100 UNIT/ML FlexPen Commonly known as: NOVOLOG Inject 4 Units into the skin 3 (three) times daily with meals. Replaces: insulin aspart 100 UNIT/ML injection   insulin degludec 100 UNIT/ML FlexTouch Pen Commonly known as: TRESIBA Inject 10 Units into the skin 2 (two) times daily. What changed:  how much to take when to take this   losartan 100 MG tablet Commonly known as: COZAAR Take 100 mg by mouth daily.   tamsulosin 0.4 MG Caps capsule Commonly known as: FLOMAX Take 1 capsule (0.4 mg total) by mouth daily.               Durable Medical Equipment  (From admission, onward)           Start     Ordered   11/28/22 1441  For home use only DME Walker rolling  Once       Question Answer Comment  Walker: With 5 Inch Wheels   Patient needs a walker to treat with the following condition General weakness      11/28/22 1440            Follow-up Information     Sakai, Isami, DO Follow up in 1 week(s).  Specialty: Surgery Why: Discussed cholecystectomy Contact information: [address] [place] [address] [phone]                Discharge Exam: Ceasar MonsFiled Weights   11/28/22 0500 11/29/22 0310 11/30/22 0500  Weight: 72.4 kg 72.8 kg 63.7 kg   Condition at discharge: fair  The results of significant diagnostics from this hospitalization (including imaging, microbiology, ancillary and laboratory) are listed below for reference.   Imaging Studies: ECHOCARDIOGRAM COMPLETE  Result Date: 11/26/2022    ECHOCARDIOGRAM REPORT   Patient Name:   Alan MaxinCLINT D Nunez Date of Exam: 11/26/2022 Medical Rec #:  [id number]      Height:       70.0 in Accession #:    [id number]     Weight:       143.3 lb Date of Birth:  09/15/1956       BSA:           1.812 m Patient Age:    66 years       BP:           128/86 mmHg Patient Gender: M              HR:           64 bpm. Exam Location:  [facility] Procedure: 2D Echo, Cardiac Doppler and Color Doppler Indications:     Pericardial effusion  History:         Patient has no prior history of Echocardiogram examinations.                  Risk Factors:Diabetes. Pericardial effusion.  Sonographer:     Mikki Harbororothy Buchanan Referring Phys:  [id number] BRITTON L RUST-CHESTER Diagnosing Phys: Julien Nordmannimothy Gollan MD  Sonographer Comments: Echo performed with patient supine and on artificial respirator, suboptimal parasternal window, suboptimal apical window and Technically challenging study due to limited acoustic windows. IMPRESSIONS  1. Left ventricular ejection fraction, by estimation, is 60 to 65%. The left ventricle has normal function. The left ventricle has no regional wall motion abnormalities. Left ventricular diastolic parameters are consistent with Grade I diastolic dysfunction (impaired relaxation).  2. Right ventricular systolic function is mildly reduced. The right ventricular size is mildly enlarged. There is normal pulmonary artery systolic pressure. The estimated right ventricular systolic pressure is 29.9 mmHg.  3. The mitral valve is normal in structure. No evidence of mitral valve regurgitation. No evidence of mitral stenosis.  4. The aortic valve is normal in structure. Aortic valve regurgitation is not visualized. No aortic stenosis is present.  5. The inferior vena cava is dilated in size with <50% respiratory variability, suggesting right atrial pressure of 15 mmHg. FINDINGS  Left Ventricle: Left ventricular ejection fraction, by estimation, is 60 to 65%. The left ventricle has normal function. The left ventricle has no regional wall motion abnormalities. The left ventricular internal cavity size was normal in size. There is  no left ventricular hypertrophy. Left ventricular diastolic parameters are consistent with Grade  I diastolic dysfunction (impaired relaxation). Right Ventricle: The right ventricular size is mildly enlarged. No increase in right ventricular wall thickness. Right ventricular systolic function is mildly reduced. There is normal pulmonary artery systolic pressure. The tricuspid regurgitant velocity  is 1.93 m/s, and with an assumed right atrial pressure of 15 mmHg, the estimated right ventricular systolic pressure is 29.9 mmHg. Left Atrium: Left atrial size was normal in size. Right Atrium: Right atrial size was normal in size. Pericardium: There is no evidence of  pericardial effusion. Mitral Valve: The mitral valve is normal in structure. No evidence of mitral valve regurgitation. No evidence of mitral valve stenosis. MV peak gradient, 3.0 mmHg. The mean mitral valve gradient is 1.0 mmHg. Tricuspid Valve: The tricuspid valve is normal in structure. Tricuspid valve regurgitation is not demonstrated. No evidence of tricuspid stenosis. Aortic Valve: The aortic valve is normal in structure. Aortic valve regurgitation is not visualized. No aortic stenosis is present. Aortic valve mean gradient measures 4.0 mmHg. Aortic valve peak gradient measures 8.9 mmHg. Aortic valve area, by VTI measures 1.61 cm. Pulmonic Valve: The pulmonic valve was normal in structure. Pulmonic valve regurgitation is not visualized. No evidence of pulmonic stenosis. Aorta: The aortic root is normal in size and structure. Venous: The inferior vena cava is dilated in size with less than 50% respiratory variability, suggesting right atrial pressure of 15 mmHg. IAS/Shunts: No atrial level shunt detected by color flow Doppler.  LEFT VENTRICLE PLAX 2D LVIDd:         4.00 cm   Diastology LVIDs:         2.80 cm   LV e' medial:    8.59 cm/s LV PW:         1.00 cm   LV E/e' medial:  5.7 LV IVS:        1.30 cm   LV e' lateral:   9.03 cm/s LVOT diam:     1.90 cm   LV E/e' lateral: 5.4 LV SV:         41 LV SV Index:   23 LVOT Area:     2.84 cm  LEFT  ATRIUM           Index LA diam:      3.40 cm 1.88 cm/m LA Vol (A4C): 32.8 ml 18.11 ml/m  AORTIC VALVE                    PULMONIC VALVE AV Area (Vmax):    1.67 cm     PV Vmax:       0.95 m/s AV Area (Vmean):   1.70 cm     PV Peak grad:  3.6 mmHg AV Area (VTI):     1.61 cm AV Vmax:           149.00 cm/s AV Vmean:          93.700 cm/s AV VTI:            0.253 m AV Peak Grad:      8.9 mmHg AV Mean Grad:      4.0 mmHg LVOT Vmax:         87.80 cm/s LVOT Vmean:        56.300 cm/s LVOT VTI:          0.144 m LVOT/AV VTI ratio: 0.57  AORTA Ao Root diam: 3.90 cm MITRAL VALVE               TRICUSPID VALVE MV Area (PHT): 3.45 cm    TR Peak grad:   14.9 mmHg MV Area VTI:   1.76 cm    TR Vmax:        193.00 cm/s MV Peak grad:  3.0 mmHg MV Mean grad:  1.0 mmHg    SHUNTS MV Vmax:       0.86 m/s    Systemic VTI:  0.14 m MV Vmean:      41.0 cm/s   Systemic Diam: 1.90 cm MV Decel Time: 220 msec  MV E velocity: 48.70 cm/s MV A velocity: 61.80 cm/s MV E/A ratio:  0.79 Julien Nordmannimothy Gollan MD Electronically signed by Julien Nordmannimothy Gollan MD Signature Date/Time: 11/26/2022/4:02:47 PM    Final    CT HEAD WO CONTRAST (5MM)  Result Date: 11/25/2022 CLINICAL DATA:  Mental status changes, unknown cause, with hyperglycemia and possible sepsis. EXAM: CT HEAD WITHOUT CONTRAST CT ABDOMEN AND PELVIS WITHOUT CONTRAST TECHNIQUE: Contiguous axial images were obtained from the base of the skull through the vertex without intravenous contrast. Multiplanar CT image reconstructions were also generated. Multidetector CT imaging of the abdomen and pelvis was performed following the standard protocol without IV contrast. Multiplanar CT image reconstructions were also generated. RADIATION DOSE REDUCTION: This exam was performed according to the departmental dose-optimization program which includes automated exposure control, adjustment of the mA and/or kV according to patient size and/or use of iterative reconstruction technique. COMPARISON:  MRI brain  06/10/2020, CT abdomen and pelvis with contrast 02/25/2018, CT abdomen and pelvis without contrast 09/15/2010. FINDINGS: CT HEAD FINDINGS Brain: There is mild cerebral atrophy, small-vessel disease and atrophic ventriculomegaly. Unremarkable cerebellum and brainstem. No asymmetry is seen concerning for an acute cortical based infarct, hemorrhage, mass or mass effect. There is no midline shift. Basal cisterns are patent. Vascular: There are patchy calcifications in the carotid siphons but no hyperdense central vasculature. Skull: Negative for fractures or focal lesions. Sinuses/Orbits: Negative orbits apart from old lens replacements. There is patchy opacification of the left ethmoid air cells increased from the prior study. The other paranasal sinuses are clear. There is patchy fluid in the lower mastoid air cells on both sides also increased. Other: Orotracheal and oroenteric intubation. CT ABDOMEN AND PELVIS FINDINGS Technical note: Streak artifact from the patient's arms in the field and due to overlying metallic wiring, limits the abdominal study. Lower chest: Interval new finding patchy airspace consolidation with air bronchograms in the left lower lobe basal segments and in the posteromedial basal right lower lobe. Findings most likely due to pneumonia or aspiration. Small anterior pericardial effusion new from prior studies. The cardiac size is normal. There are calcifications in the left main coronary artery and there are calcifications in the aortic valve leaflets. Hepatobiliary: The liver is 18 cm length and mildly steatotic. No mass is seen through the streak and metallic artifacts. There are several small layering stones in the proximal gallbladder but no wall thickening no biliary dilatation. Pancreas: There is pancreatic and peripancreatic edema consistent with acute interstitial pancreatitis. There is minimal nonlocalizing fluid around the head of pancreas extending into the pancreaticoduodenal groove  with no other visible peripancreatic fluid. No mass is seen without contrast, no peripancreatic abscess or hemorrhage. Spleen: Unremarkable without contrast. Adrenals/Urinary Tract: There is no adrenal mass. Bilateral perinephric stranding is similar to both prior studies consistent with chronic senescent stranding. There is a small chronic cyst in the medial lower pole left kidney. No other contour deforming renal abnormality is seen. There are multiple bilateral nonobstructive calyceal stones, measuring from 2-5 mm on both sides but there is no hydronephrosis or ureteral stone on either side. The bladder is catheterized and mostly decompressed, but unremarkable for the degree of distention. Stomach/Bowel: NGT is in place terminating in the distal gastric body. There is mild gastric distention with air and fluid. The unopacified small bowel is normal caliber. There are thickened folds in the left abdominal small bowel with adjacent hazy mesenteric reaction, findings consistent with nonspecific enteritis. There are thickened folds in the pelvic small bowel without  mesenteric inflammatory change. The appendix is normal. There is wall prominence versus nondistention in the ascending colon, left colonic diverticulosis without evidence of acute diverticulitis. Vascular/Lymphatic: Multiple pelvic phleboliths. There is mild aortoiliac atherosclerotic calcification without AAA. No lymphadenopathy is seen. There is mild aortic branch vessel atherosclerosis. Reproductive: The prostate is slightly enlarged but unchanged. Other: Trace pelvic ascites. No free air, free hemorrhage or incarcerated hernia. There are tiny inguinal fat hernias. Musculoskeletal: Mild osteopenia and degenerative change lumbar spine. No primary pathologic bone lesion is seen. IMPRESSION: 1. No acute intracranial CT findings or interval changes. Stable mild atrophy and small vessel change. 2. Partial opacification of left ethmoid air cells, with  increased fluid in the lower mastoid air cells bilaterally. 3. Bilateral left-greater-than-right lower lobe airspace consolidation with air bronchograms, most likely due to pneumonia or aspiration. 4. Aortic and coronary artery atherosclerosis. 5. Calcification in the aortic valve leaflets. Echocardiographic assessment may be indicated. 6. Orotracheal and oroenteric intubation. 7. CT evidence for acute interstitial pancreatitis with minimal nonlocalizing fluid around the head of the pancreas extending into the pancreaticoduodenal groove. No peripancreatic abscess or hemorrhage. 8. Cholelithiasis without evidence of cholecystitis. 9. Nonobstructive nephrolithiasis. 10. Nonspecific enteritis/enteropathy with thickened folds in the left abdominal small bowel and in the pelvic small bowel, and mesenteric inflammatory reaction in the left abdomen. 11. Ascending colitis versus nondistention. 12. Trace pelvic ascites. 13. Diverticulosis without evidence of diverticulitis. 14. Prostatomegaly. Aortic Atherosclerosis (ICD10-I70.0). Electronically Signed   By: Almira Bar M.D.   On: 11/25/2022 21:12   CT ABDOMEN PELVIS WO CONTRAST  Result Date: 11/25/2022 CLINICAL DATA:  Mental status changes, unknown cause, with hyperglycemia and possible sepsis. EXAM: CT HEAD WITHOUT CONTRAST CT ABDOMEN AND PELVIS WITHOUT CONTRAST TECHNIQUE: Contiguous axial images were obtained from the base of the skull through the vertex without intravenous contrast. Multiplanar CT image reconstructions were also generated. Multidetector CT imaging of the abdomen and pelvis was performed following the standard protocol without IV contrast. Multiplanar CT image reconstructions were also generated. RADIATION DOSE REDUCTION: This exam was performed according to the departmental dose-optimization program which includes automated exposure control, adjustment of the mA and/or kV according to patient size and/or use of iterative reconstruction technique.  COMPARISON:  MRI brain 06/10/2020, CT abdomen and pelvis with contrast 02/25/2018, CT abdomen and pelvis without contrast 09/15/2010. FINDINGS: CT HEAD FINDINGS Brain: There is mild cerebral atrophy, small-vessel disease and atrophic ventriculomegaly. Unremarkable cerebellum and brainstem. No asymmetry is seen concerning for an acute cortical based infarct, hemorrhage, mass or mass effect. There is no midline shift. Basal cisterns are patent. Vascular: There are patchy calcifications in the carotid siphons but no hyperdense central vasculature. Skull: Negative for fractures or focal lesions. Sinuses/Orbits: Negative orbits apart from old lens replacements. There is patchy opacification of the left ethmoid air cells increased from the prior study. The other paranasal sinuses are clear. There is patchy fluid in the lower mastoid air cells on both sides also increased. Other: Orotracheal and oroenteric intubation. CT ABDOMEN AND PELVIS FINDINGS Technical note: Streak artifact from the patient's arms in the field and due to overlying metallic wiring, limits the abdominal study. Lower chest: Interval new finding patchy airspace consolidation with air bronchograms in the left lower lobe basal segments and in the posteromedial basal right lower lobe. Findings most likely due to pneumonia or aspiration. Small anterior pericardial effusion new from prior studies. The cardiac size is normal. There are calcifications in the left main coronary artery and there are calcifications in  the aortic valve leaflets. Hepatobiliary: The liver is 18 cm length and mildly steatotic. No mass is seen through the streak and metallic artifacts. There are several small layering stones in the proximal gallbladder but no wall thickening no biliary dilatation. Pancreas: There is pancreatic and peripancreatic edema consistent with acute interstitial pancreatitis. There is minimal nonlocalizing fluid around the head of pancreas extending into the  pancreaticoduodenal groove with no other visible peripancreatic fluid. No mass is seen without contrast, no peripancreatic abscess or hemorrhage. Spleen: Unremarkable without contrast. Adrenals/Urinary Tract: There is no adrenal mass. Bilateral perinephric stranding is similar to both prior studies consistent with chronic senescent stranding. There is a small chronic cyst in the medial lower pole left kidney. No other contour deforming renal abnormality is seen. There are multiple bilateral nonobstructive calyceal stones, measuring from 2-5 mm on both sides but there is no hydronephrosis or ureteral stone on either side. The bladder is catheterized and mostly decompressed, but unremarkable for the degree of distention. Stomach/Bowel: NGT is in place terminating in the distal gastric body. There is mild gastric distention with air and fluid. The unopacified small bowel is normal caliber. There are thickened folds in the left abdominal small bowel with adjacent hazy mesenteric reaction, findings consistent with nonspecific enteritis. There are thickened folds in the pelvic small bowel without mesenteric inflammatory change. The appendix is normal. There is wall prominence versus nondistention in the ascending colon, left colonic diverticulosis without evidence of acute diverticulitis. Vascular/Lymphatic: Multiple pelvic phleboliths. There is mild aortoiliac atherosclerotic calcification without AAA. No lymphadenopathy is seen. There is mild aortic branch vessel atherosclerosis. Reproductive: The prostate is slightly enlarged but unchanged. Other: Trace pelvic ascites. No free air, free hemorrhage or incarcerated hernia. There are tiny inguinal fat hernias. Musculoskeletal: Mild osteopenia and degenerative change lumbar spine. No primary pathologic bone lesion is seen. IMPRESSION: 1. No acute intracranial CT findings or interval changes. Stable mild atrophy and small vessel change. 2. Partial opacification of left  ethmoid air cells, with increased fluid in the lower mastoid air cells bilaterally. 3. Bilateral left-greater-than-right lower lobe airspace consolidation with air bronchograms, most likely due to pneumonia or aspiration. 4. Aortic and coronary artery atherosclerosis. 5. Calcification in the aortic valve leaflets. Echocardiographic assessment may be indicated. 6. Orotracheal and oroenteric intubation. 7. CT evidence for acute interstitial pancreatitis with minimal nonlocalizing fluid around the head of the pancreas extending into the pancreaticoduodenal groove. No peripancreatic abscess or hemorrhage. 8. Cholelithiasis without evidence of cholecystitis. 9. Nonobstructive nephrolithiasis. 10. Nonspecific enteritis/enteropathy with thickened folds in the left abdominal small bowel and in the pelvic small bowel, and mesenteric inflammatory reaction in the left abdomen. 11. Ascending colitis versus nondistention. 12. Trace pelvic ascites. 13. Diverticulosis without evidence of diverticulitis. 14. Prostatomegaly. Aortic Atherosclerosis (ICD10-I70.0). Electronically Signed   By: Telford Nab M.D.   On: 11/25/2022 21:12   DG Abdomen 1 View  Result Date: 11/25/2022 CLINICAL DATA:  NG tube placement EXAM: ABDOMEN - 1 VIEW limited for tube placement COMPARISON:  X-ray 1,011.  CT 2019 FINDINGS: Limited x-ray for tube placement has side-hole at the GE junction with tip overlying the stomach. This could be advanced further into the stomach. IMPRESSION: Limited x-ray for tube placement has side-hole at the GE junction with tip overlying the stomach. Electronically Signed   By: Jill Side M.D.   On: 11/25/2022 20:35   DG Chest Portable 1 View  Result Date: 11/25/2022 CLINICAL DATA:  Intubation EXAM: PORTABLE CHEST 1 VIEW COMPARISON:  None  available.  X-ray report from 2004 FINDINGS: ET tube seen with tip 6.5 cm above the carina. Enteric tube with tip extending beneath the diaphragm with the side hole near the GE  junction. This could be advanced further into the stomach. There is some linear opacity left lung base. No consolidation, pneumothorax, effusion or edema. Normal cardiopericardial silhouette. Calcified aorta. Overlapping cardiac leads. Film is rotated to the left. The right inferior costophrenic angle is clipped at the edge of the film. IMPRESSION: 1. ET tube tip 6.5 cm above the carina. 2. Enteric tube with tip extending beneath the diaphragm with the side hole near the GE junction. This could be advanced further into the stomach. 3. Mild left basilar atelectasis Electronically Signed   By: Karen Kays M.D.   On: 11/25/2022 20:34    Microbiology: Results for orders placed or performed during the hospital encounter of 11/25/22  Resp panel by RT-PCR (RSV, Flu A&B, Covid) Anterior Nasal Swab     Status: None   Collection Time: 11/25/22  7:31 PM   Specimen: Anterior Nasal Swab  Result Value Ref Range Status   SARS Coronavirus 2 by RT PCR NEGATIVE NEGATIVE Final    Comment: (NOTE) SARS-CoV-2 target nucleic acids are NOT DETECTED.  The SARS-CoV-2 RNA is generally detectable in upper respiratory specimens during the acute phase of infection. The lowest concentration of SARS-CoV-2 viral copies this assay can detect is 138 copies/mL. A negative result does not preclude SARS-Cov-2 infection and should not be used as the sole basis for treatment or other patient management decisions. A negative result may occur with  improper specimen collection/handling, submission of specimen other than nasopharyngeal swab, presence of viral mutation(s) within the areas targeted by this assay, and inadequate number of viral copies(<138 copies/mL). A negative result must be combined with clinical observations, patient history, and epidemiological information. The expected result is Negative.  Fact Sheet for Patients:  BloggerCourse.com  Fact Sheet for Healthcare Providers:   SeriousBroker.it  This test is no t yet approved or cleared by the [place] FDA and  has been authorized for detection and/or diagnosis of SARS-CoV-2 by FDA under an Emergency Use Authorization (EUA). This EUA will remain  in effect (meaning this test can be used) for the duration of the COVID-19 declaration under Section 564(b)(1) of the Act, 21 U.S.C.section 360bbb-3(b)(1), unless the authorization is terminated  or revoked sooner.       Influenza A by PCR NEGATIVE NEGATIVE Final   Influenza B by PCR NEGATIVE NEGATIVE Final    Comment: (NOTE) The Xpert Xpress SARS-CoV-2/FLU/RSV plus assay is intended as an aid in the diagnosis of influenza from Nasopharyngeal swab specimens and should not be used as a sole basis for treatment. Nasal washings and aspirates are unacceptable for Xpert Xpress SARS-CoV-2/FLU/RSV testing.  Fact Sheet for Patients: BloggerCourse.com  Fact Sheet for Healthcare Providers: SeriousBroker.it  This test is not yet approved or cleared by the [place] FDA and has been authorized for detection and/or diagnosis of SARS-CoV-2 by FDA under an Emergency Use Authorization (EUA). This EUA will remain in effect (meaning this test can be used) for the duration of the COVID-19 declaration under Section 564(b)(1) of the Act, 21 U.S.C. section 360bbb-3(b)(1), unless the authorization is terminated or revoked.     Resp Syncytial Virus by PCR NEGATIVE NEGATIVE Final    Comment: (NOTE) Fact Sheet for Patients: BloggerCourse.com  Fact Sheet for Healthcare Providers: SeriousBroker.it  This test is not yet approved or cleared by  the [place] FDA and has been authorized for detection and/or diagnosis of SARS-CoV-2 by FDA under an Emergency Use Authorization (EUA). This EUA will remain in effect (meaning this test can be used) for  the duration of the COVID-19 declaration under Section 564(b)(1) of the Act, 21 U.S.C. section 360bbb-3(b)(1), unless the authorization is terminated or revoked.  Performed at [facility], [address] [place], [place] [address]   MRSA Next Gen by PCR, Nasal     Status: None   Collection Time: 11/25/22  8:57 PM   Specimen: Nasal Mucosa; Nasal Swab  Result Value Ref Range Status   MRSA by PCR Next Gen NOT DETECTED NOT DETECTED Final    Comment: (NOTE) The GeneXpert MRSA Assay (FDA approved for NASAL specimens only), is one component of a comprehensive MRSA colonization surveillance program. It is not intended to diagnose MRSA infection nor to guide or monitor treatment for MRSA infections. Test performance is not FDA approved in patients less than 66 years old. Performed at [facility], [address] [place], [place] [address]   Culture, blood (Routine X 2) w Reflex to ID Panel     Status: None (Preliminary result)   Collection Time: 11/26/22 12:35 PM   Specimen: BLOOD RIGHT HAND  Result Value Ref Range Status   Specimen Description BLOOD RIGHT HAND  Final   Special Requests   Final    BOTTLES DRAWN AEROBIC ONLY Blood Culture results may not be optimal due to an inadequate volume of blood received in culture bottles   Culture   Final    NO GROWTH 4 DAYS Performed at [facility], [address] [place], [place] [address]    Report Status PENDING  Incomplete  Culture, blood (Routine X 2) w Reflex to ID Panel     Status: None (Preliminary result)   Collection Time: 11/26/22 12:35 PM   Specimen: BLOOD RIGHT HAND  Result Value Ref Range Status   Specimen Description BLOOD RIGHT HAND  Final   Special Requests   Final    BOTTLES DRAWN AEROBIC AND ANAEROBIC Blood Culture results may not be optimal due to an inadequate volume of blood received in culture bottles   Culture   Final    NO GROWTH 4 DAYS Performed at [facility], [address] [place], [place] [address]    Report Status PENDING  Incomplete  Culture, Respiratory w Gram Stain     Status: None   Collection Time: 11/26/22  4:56 PM   Specimen: Tracheal Aspirate; Respiratory  Result Value Ref Range Status   Specimen Description   Final    TRACHEAL ASPIRATE Performed at [facility], [address] [place], [place] [address]    Special Requests   Final    NONE Performed at [facility], [address] [place], [place] [address]    Gram Stain   Final    FEW WBC PRESENT, PREDOMINANTLY PMN RARE GRAM POSITIVE COCCI Performed at [facility], [address] [place], [place] [address]    Culture FEW ESCHERICHIA COLI  Final   Report Status 11/29/2022 FINAL  Final   Organism ID, Bacteria ESCHERICHIA COLI  Final      Susceptibility   Escherichia coli - MIC*    AMPICILLIN 8 SENSITIVE Sensitive     CEFAZOLIN <=4 SENSITIVE Sensitive     CEFEPIME <=0.12 SENSITIVE Sensitive     CEFTAZIDIME <=1 SENSITIVE Sensitive     CEFTRIAXONE <=  0.25 SENSITIVE Sensitive     CIPROFLOXACIN <=0.25 SENSITIVE Sensitive     GENTAMICIN <=1 SENSITIVE Sensitive     IMIPENEM <=0.25 SENSITIVE Sensitive     TRIMETH/SULFA <=20 SENSITIVE Sensitive     AMPICILLIN/SULBACTAM <=2 SENSITIVE Sensitive     PIP/TAZO <=4 SENSITIVE Sensitive     * FEW ESCHERICHIA COLI    Labs: CBC: Recent Labs  Lab 11/25/22 1700 11/26/22 0304 11/26/22 1610 11/27/22 0426 11/28/22 0442 11/29/22 0353 11/30/22 0358  WBC 23.0* 9.5  --  6.2 6.5 6.1 5.3  NEUTROABS 17.1*  --   --   --   --   --   --   HGB 11.8* 9.8* 11.6* 9.7* 10.2* 10.1* 10.1*  HCT 41.1 30.9* 34.2* 28.5* 31.8* 31.1* 31.1*  MCV 106.5* 96.9  --  89.9 94.1 93.4 93.4  PLT 315 169  --  135* 119* 120* 123*   Basic Metabolic Panel: Recent Labs  Lab 11/26/22 0304 11/26/22 0935 11/27/22 0417 11/27/22 0426 11/27/22 1747 11/28/22 0442 11/28/22 0443 11/29/22 0353 11/30/22 0358  NA 140   < >  --  144  --  141  142 142 135  K 3.8   < >  --  3.9  --  3.7 3.6 3.4* 3.7  CL 102   < >  --  109  --  106 107 106 104  CO2 15*   < >  --  28  --  28 28 26 27   GLUCOSE 570*   < >  --  141*  --  133* 136* 108* 197*  BUN 49*   < >  --  32*  --  22 22 14 12   CREATININE 2.10*   < >  --  1.41*  --  1.34* 1.28* 1.10 1.21  CALCIUM 9.2   < >  --  8.7*  --  8.4* 8.5* 8.7* 8.5*  MG 2.3  --   --  1.8  --  1.8  --  2.0 2.2  PHOS 2.6  --  <1.0*  --  2.5  --  2.8 2.1* 2.6   < > = values in this interval not displayed.   Liver Function Tests: Recent Labs  Lab 11/25/22 1700 11/26/22 0259 11/27/22 0426 11/28/22 0443 11/29/22 0353 11/30/22 0358  AST 25 22 28   --  58* 45*  ALT 21 20 16   --  36 37  ALKPHOS 94 68 59  --  166* 202*  BILITOT 2.4* 2.3* 0.7  --  0.6 0.7  PROT 6.2* 4.6* 4.1*  --  5.0* 5.4*  ALBUMIN 3.7 2.7* 2.2* 2.3* 2.5* 2.9*   CBG: Recent Labs  Lab 11/29/22 1654 11/29/22 1956 11/29/22 2340 11/30/22 0421 11/30/22 0735  GLUCAP 299* 243* 206* 173* 122*    Discharge time spent: greater than 30 minutes.  Signed: 12/01/22 , MD [facility] 11/30/2022

## 2022-11-30 NOTE — Inpatient Diabetes Management (Signed)
Inpatient Diabetes Program Recommendations  AACE/ADA: New Consensus Statement on Inpatient Glycemic Control   Target Ranges:  Prepandial:   less than 140 mg/dL      Peak postprandial:   less than 180 mg/dL (1-2 hours)      Critically ill patients:  140 - 180 mg/dL    Latest Reference Range & Units 11/30/22 04:21 11/30/22 07:35  Glucose-Capillary 70 - 99 mg/dL 173 (H) 122 (H)    Latest Reference Range & Units 11/29/22 07:42 11/29/22 11:52 11/29/22 16:54 11/29/22 19:56 11/29/22 23:40  Glucose-Capillary 70 - 99 mg/dL 103 (H) 279 (H) 299 (H) 243 (H) 206 (H)    Latest Reference Range & Units 11/25/22 17:00 11/29/22 03:53  Hemoglobin A1C 4.8 - 5.6 % 12.8 (H) 12.7 (H)   Review of Glycemic Control  Diabetes history: DM2 Outpatient Diabetes medications: Tresiba 12 units daily, V-Go 40 with Novolog (provides 40  units of basal insulin per day; uses 3-8 clicks (each click is 2 units) with meals based on glucose, Metformin 1000 mg BID Current orders for Inpatient glycemic control: Semglee 10 units BID, Novolog 0-9 units Q4H  Inpatient Diabetes Program Recommendations:    Insulin:  Please consider changing CBGs to AC&HS, Novolog 0-9 units AC&HS, and ordering Novolog 4 units TID with meals for meal coverage if patient eats at least 50% of meals.  HbgA1C:  A1C 12.7% on 11/29/22 indicating an average glucose of 318 mg/dl over the past 2-3 months.  Thanks, Barnie Alderman, RN, MSN, [place] Diabetes Coordinator Inpatient Diabetes Program [phone] (Team Pager from 8am to [place])

## 2022-11-30 NOTE — TOC Transition Note (Signed)
Transition of Care [facility]) - CM/SW Discharge Note   Patient Details  Name: Alan Nunez MRN: [id number] Date of Birth: 1956/03/04  [facility] of [facility]) CM/SW Contact:  Gerilyn Pilgrim, LCSW Phone Number: 11/30/2022, 9:09 AM   Clinical Narrative:  Pt has orders in to discharge. RW ordered through [employer]. Rhonda notified. Merleen Nicely with [facility] notified of discharge. [facility] accepted for [facility] PT/RN. CSW signing off.     Final next level of care: Home w Home Health Services Barriers to Discharge: Barriers Resolved   Patient Goals and CMS Choice   Choice offered to / list presented to : Spouse  Discharge Placement                         Discharge Plan and Services Additional resources added to the After Visit Summary for                  DME Arranged: [place] rolling DME Agency: [employer]       HH Arranged: PT, RN [place] Agency: [facility] Date [facility] Agency Contacted: 11/29/22 Time [place]: 0300 Representative spoke with at [place]: [place] Determinants of Health ([place]) Interventions SDOH Screenings   Tobacco Use: Low Risk  (11/25/2022)     Readmission Risk Interventions     No data to display

## 2022-12-01 LAB — CULTURE, BLOOD (ROUTINE X 2)
Culture: NO GROWTH
Culture: NO GROWTH

## 2022-12-02 DIAGNOSIS — E1010 Type 1 diabetes mellitus with ketoacidosis without coma: Secondary | ICD-10-CM | POA: Diagnosis not present

## 2022-12-02 DIAGNOSIS — Z09 Encounter for follow-up examination after completed treatment for conditions other than malignant neoplasm: Secondary | ICD-10-CM | POA: Diagnosis not present

## 2022-12-02 DIAGNOSIS — E1011 Type 1 diabetes mellitus with ketoacidosis with coma: Principal | ICD-10-CM

## 2022-12-02 DIAGNOSIS — Z029 Encounter for administrative examinations, unspecified: Secondary | ICD-10-CM | POA: Diagnosis not present

## 2022-12-02 DIAGNOSIS — K529 Noninfective gastroenteritis and colitis, unspecified: Secondary | ICD-10-CM | POA: Diagnosis not present

## 2022-12-07 ENCOUNTER — Ambulatory Visit: Payer: Self-pay | Admitting: Surgery

## 2022-12-07 DIAGNOSIS — K851 Biliary acute pancreatitis without necrosis or infection: Secondary | ICD-10-CM | POA: Diagnosis not present

## 2022-12-07 NOTE — H&P (View-Only) (Signed)
Subjective:   CC: Gallstone pancreatitis [K85.10]  HPI:  Alan Nunez is a 67 y.o. male who returns for evaluation of above CC. No issues since d/c.  F/u with IM and endo ongoing for DM care.  Past Medical History:  has a past medical history of Arthritis, Cervicalgia, Diabetes mellitus type 2, uncomplicated (CMS-HCC), Disorder of vestibular function, Erectile dysfunction, Esophageal reflux, Essential hypertension, benign, GERD (gastroesophageal reflux disease), Low testosterone, Mitral valve disorders(424.0), Pain of cervical spine, and Vestibular dysfunction.  Past Surgical History:  has a past surgical history that includes Tonsillectomy; Hernia repair (Right); Cervical Rods inserted; and Spine surgery.  Family History: family history includes Arthritis in his mother; Breast cancer in his sister; Cancer in his sister; Coronary Artery Disease (Blocked arteries around heart) in his brother; Dementia in his father; Diabetes in his sister; ESRD-Dialysis in his mother; Heart disease in his sister; High blood pressure (Hypertension) in his mother; Kidney disease in his mother.  Social History:  reports that he has never smoked. He has never used smokeless tobacco. He reports that he does not drink alcohol and does not use drugs.  Current Medications: has a current medication list which includes the following prescription(s): amlodipine, freestyle libre 3 sensor, donepezil, insulin aspart, insulin degludec, losartan, meloxicam, metformin, omeprazole, pen needle, diabetic, potassium, v-go 40, tizanidine, docosahexaenoic acid/epa, and donepezil.  Allergies:  Allergies as of 12/07/2022 - Reviewed 12/07/2022  Allergen Reaction Noted   Codeine sulfate Other (See Comments) 03/27/2014   Morphine Hives and Rash 03/27/2014   Prednisone Itching, Rash, and Other (See Comments) 04/02/2019    ROS:  A 15 point review of systems was performed and pertinent positives and negatives noted in HPI     Objective:     BP (!) 151/82   Pulse 63   Ht 172.7 cm (5' 7.99")   Wt 70.4 kg (155 lb 3.3 oz)   BMI 23.60 kg/m    Constitutional :  No distress, cooperative, alert  Lymphatics/Throat:  Supple with no lymphadenopathy  Respiratory:  Clear to auscultation bilaterally  Cardiovascular:  Regular rate and rhythm  Gastrointestinal: Soft, non-tender, non-distended, no organomegaly.  Musculoskeletal: Steady gait and movement  Skin: Cool and moist  Psychiatric: Normal affect, non-agitated, not confused       LABS:  N/a   RADS: N/a Assessment:      Gallstone pancreatitis [K85.10]- will proceed with robo lap chole to prevent recurrence.  Plan:     1. Gallstone pancreatitis [K85.10] Discussed the risk of surgery including post-op infxn, seroma, biloma, chronic pain, poor-delayed wound healing, retained gallstone, conversion to open procedure, post-op SBO or ileus, and need for additional procedures to address said risks.  The risks of general anesthetic including MI, CVA, sudden death or even reaction to anesthetic medications also discussed. Alternatives include continued observation.  Benefits include possible symptom relief, prevention of complications including acute cholecystitis, pancreatitis.  Typical post operative recovery of 3-5 days rest, continued pain in area and incision sites, possible loose stools up to 4-6 weeks, also discussed.  ED return precautions given for sudden increase in RUQ pain, with possible accompanying fever, nausea, and/or vomiting.  The patient understands the risks, any and all questions were answered to the patient's satisfaction.  2. Patient has elected to proceed with surgical treatment. Procedure will be scheduled.  Written consent was obtained .robotic assisted laparoscopic  labs/images/medications/previous chart entries reviewed personally and relevant changes/updates noted above.

## 2022-12-07 NOTE — H&P (Signed)
Subjective:   CC: Gallstone pancreatitis [K85.10]  HPI:  Alan Nunez is a 66 y.o. male who returns for evaluation of above CC. No issues since d/c.  F/u with IM and endo ongoing for DM care.  Past Medical History:  has a past medical history of Arthritis, Cervicalgia, Diabetes mellitus type 2, uncomplicated (CMS-HCC), Disorder of vestibular function, Erectile dysfunction, Esophageal reflux, Essential hypertension, benign, GERD (gastroesophageal reflux disease), Low testosterone, Mitral valve disorders(424.0), Pain of cervical spine, and Vestibular dysfunction.  Past Surgical History:  has a past surgical history that includes Tonsillectomy; Hernia repair (Right); Cervical Rods inserted; and Spine surgery.  Family History: family history includes Arthritis in his mother; Breast cancer in his sister; Cancer in his sister; Coronary Artery Disease (Blocked arteries around heart) in his brother; Dementia in his father; Diabetes in his sister; ESRD-Dialysis in his mother; Heart disease in his sister; High blood pressure (Hypertension) in his mother; Kidney disease in his mother.  Social History:  reports that he has never smoked. He has never used smokeless tobacco. He reports that he does not drink alcohol and does not use drugs.  Current Medications: has a current medication list which includes the following prescription(s): amlodipine, freestyle libre 3 sensor, donepezil, insulin aspart, insulin degludec, losartan, meloxicam, metformin, omeprazole, pen needle, diabetic, potassium, v-go 40, tizanidine, docosahexaenoic acid/epa, and donepezil.  Allergies:  Allergies as of 12/07/2022 - Reviewed 12/07/2022  Allergen Reaction Noted   Codeine sulfate Other (See Comments) 03/27/2014   Morphine Hives and Rash 03/27/2014   Prednisone Itching, Rash, and Other (See Comments) 04/02/2019    ROS:  A 15 point review of systems was performed and pertinent positives and negatives noted in HPI     Objective:     BP (!) 151/82   Pulse 63   Ht 172.7 cm (5' 7.99")   Wt 70.4 kg (155 lb 3.3 oz)   BMI 23.60 kg/m    Constitutional :  No distress, cooperative, alert  Lymphatics/Throat:  Supple with no lymphadenopathy  Respiratory:  Clear to auscultation bilaterally  Cardiovascular:  Regular rate and rhythm  Gastrointestinal: Soft, non-tender, non-distended, no organomegaly.  Musculoskeletal: Steady gait and movement  Skin: Cool and moist  Psychiatric: Normal affect, non-agitated, not confused       LABS:  N/a   RADS: N/a Assessment:      Gallstone pancreatitis [K85.10]- will proceed with robo lap chole to prevent recurrence.  Plan:     1. Gallstone pancreatitis [K85.10] Discussed the risk of surgery including post-op infxn, seroma, biloma, chronic pain, poor-delayed wound healing, retained gallstone, conversion to open procedure, post-op SBO or ileus, and need for additional procedures to address said risks.  The risks of general anesthetic including MI, CVA, sudden death or even reaction to anesthetic medications also discussed. Alternatives include continued observation.  Benefits include possible symptom relief, prevention of complications including acute cholecystitis, pancreatitis.  Typical post operative recovery of 3-5 days rest, continued pain in area and incision sites, possible loose stools up to 4-6 weeks, also discussed.  ED return precautions given for sudden increase in RUQ pain, with possible accompanying fever, nausea, and/or vomiting.  The patient understands the risks, any and all questions were answered to the patient's satisfaction.  2. Patient has elected to proceed with surgical treatment. Procedure will be scheduled.  Written consent was obtained .robotic assisted laparoscopic  labs/images/medications/previous chart entries reviewed personally and relevant changes/updates noted above. 

## 2022-12-09 DIAGNOSIS — E785 Hyperlipidemia, unspecified: Secondary | ICD-10-CM | POA: Diagnosis not present

## 2022-12-09 DIAGNOSIS — Z794 Long term (current) use of insulin: Secondary | ICD-10-CM | POA: Diagnosis not present

## 2022-12-09 DIAGNOSIS — R809 Proteinuria, unspecified: Secondary | ICD-10-CM | POA: Diagnosis not present

## 2022-12-09 DIAGNOSIS — E1169 Type 2 diabetes mellitus with other specified complication: Secondary | ICD-10-CM | POA: Diagnosis not present

## 2022-12-09 DIAGNOSIS — I152 Hypertension secondary to endocrine disorders: Secondary | ICD-10-CM | POA: Diagnosis not present

## 2022-12-09 DIAGNOSIS — E1159 Type 2 diabetes mellitus with other circulatory complications: Secondary | ICD-10-CM | POA: Diagnosis not present

## 2022-12-09 DIAGNOSIS — E1129 Type 2 diabetes mellitus with other diabetic kidney complication: Secondary | ICD-10-CM | POA: Diagnosis not present

## 2022-12-21 ENCOUNTER — Other Ambulatory Visit: Payer: Self-pay

## 2022-12-21 ENCOUNTER — Encounter
Admission: RE | Admit: 2022-12-21 | Discharge: 2022-12-21 | Disposition: A | Discharge status: Home / Self Care | Payer: No Typology Code available for payment source | Source: Ambulatory Visit | Attending: Surgery | Admitting: Surgery

## 2022-12-21 VITALS — Ht 70.0 in | Wt 147.0 lb

## 2022-12-21 DIAGNOSIS — I10 Essential (primary) hypertension: Secondary | ICD-10-CM

## 2022-12-21 DIAGNOSIS — E1111 Type 2 diabetes mellitus with ketoacidosis with coma: Secondary | ICD-10-CM

## 2022-12-21 HISTORY — DX: Sleep apnea, unspecified: G47.30

## 2022-12-21 HISTORY — DX: Unspecified dementia, unspecified severity, without behavioral disturbance, psychotic disturbance, mood disturbance, and anxiety: F03.90

## 2022-12-21 HISTORY — DX: Unspecified osteoarthritis, unspecified site: M19.90

## 2022-12-21 NOTE — Progress Notes (Addendum)
  Perioperative Services: Pre-Admission/Anesthesia Testing  Abnormal Lab Notification    Date: 12/21/22  Name: Alan Nunez MRN:   [id number]  Re: Abnormal labs noted during PAT appointment   Provider(s) Notified: Benjamine Sprague, DO Notification mode: Routed and/or faxed via [facility]   ABNORMAL LAB VALUE(S): Lab Results  Component Value Date   GLUCOSE 197 (H) 11/30/2022    Notes: Patient with a T2DM diagnosis. He is currently on both oral (metformin) and parenteral (aspart and degludec insulins) therapies. Last Hgb A1c was 12.7% on 11/29/2022. Patient is followed by endocrinology. He was last seen on 12/09/2022, at which time his degludec insulin dose was up titrated from 10 units to 15 units daily.   In efforts to reduce the risk of developing SSI, or other potential perioperative complications, this communication is being sent in order to determine if patient is deemed to have adequate medical optimization, including preoperative glycemic control.   With that being said, the benefit of improving glycemic control must be weighed against the overall risk associated with delaying a necessary elective orthopedic surgery for this patient. Fowarding to primary attending surgeon for review.    Honor Loh, MSN, APRN, FNP-C, CEN [facility]  Peri-operative Services Nurse Practitioner Phone: [phone] 12/21/22 10:57 AM

## 2022-12-21 NOTE — Patient Instructions (Addendum)
Your procedure is scheduled on: Wednesday 12/29/22 Report to the Registration Desk on the 1st floor of the [facility]. To find out your arrival time, please call [phone] between 1PM - 3PM on: Tuesday 12/28/22 If your arrival time is 6:00 am, do not arrive before that time as the [facility] entrance doors do not open until 6:00 am.  REMEMBER: Instructions that are not followed completely may result in serious medical risk, up to and including death; or upon the discretion of your surgeon and anesthesiologist your surgery may need to be rescheduled.  Do not eat food after midnight the night before surgery.  No gum chewing or hard candies.  You may however, drink CLEAR liquids up to 2 hours before you are scheduled to arrive for your surgery. Do not drink anything within 2 hours of your scheduled arrival time.  Clear liquids include: - water   Do NOT drink anything that is not on this list.  Type 1 and Type 2 diabetics should only drink water.  One week prior to surgery: Stop Anti-inflammatories (NSAIDS) such as Advil, Aleve, Ibuprofen, Motrin, Naproxen, Naprosyn and Aspirin based products such as Excedrin, Goody's Powder, BC Powder. Stop ANY OVER THE COUNTER supplements until after surgery. You may however, continue to take Tylenol if needed for pain up until the day of surgery.  Continue taking all prescribed medications with the exception of the following:  Hold your Metformin for 2 days before your surgery. The last dose will be Sunday 12/26/22 No insulin on the day of surgery.   Follow recommendations from Cardiologist or PCP regarding stopping blood thinners.  TAKE ONLY THESE MEDICATIONS THE MORNING OF SURGERY WITH A SIP OF WATER:  amLODipine (NORVASC) 10 MG tablet   donepezil (ARICEPT) 5 MG tablet   No Alcohol for 24 hours before or after surgery.  No Smoking including e-cigarettes for 24 hours before surgery.  No chewable tobacco products for at least 6 hours  before surgery.  No nicotine patches on the day of surgery.  Do not use any "recreational" drugs for at least a week (preferably 2 weeks) before your surgery.  Please be advised that the combination of cocaine and anesthesia may have negative outcomes, up to and including death. If you test positive for cocaine, your surgery will be cancelled.  On the morning of surgery brush your teeth with toothpaste and water, you may rinse your mouth with mouthwash if you wish. Do not swallow any toothpaste or mouthwash.  Use CHG Soap or wipes as directed on instruction sheet.  Do not wear lotions, powders, or perfumes. You may use deodorant.  Do not wear jewelry, make-up, hairpins, clips or nail polish.  Do not shave body hair from the neck down 48 hours before surgery.  Contact lenses, hearing aids and dentures may not be worn into surgery.  Do not bring valuables to the hospital. [facility] is not responsible for any missing/lost belongings or valuables.    Notify your doctor if there is any change in your medical condition (cold, fever, infection).  Wear comfortable clothing (specific to your surgery type) to the hospital.  After surgery, you can help prevent lung complications by doing breathing exercises.  Take deep breaths and cough every 1-2 hours. Your doctor may order a device called an Incentive Spirometer to help you take deep breaths. When coughing or sneezing, hold a pillow firmly against your incision with both hands. This is called "splinting." Doing this helps protect your incision. It  also decreases belly discomfort.  If you are being admitted to the hospital overnight, leave your suitcase in the car. After surgery it may be brought to your room.  In case of increased patient census, it may be necessary for you, the patient, to continue your postoperative care in the Same Day Surgery department.  If you are being discharged the day of surgery, you will not be allowed to drive  home. You will need a responsible individual to drive you home and stay with you for 24 hours after surgery.   If you are taking public transportation, you will need to have a responsible individual with you.  Please call the [facility] Dept. at [phone] if you have any questions about these instructions.  Surgery Visitation Policy:  Patients undergoing a surgery or procedure may have two family members or support persons with them as long as the person is not COVID-19 positive or experiencing its symptoms.   Inpatient Visitation:    Visiting hours are 7 a.m. to 8 p.m. Up to four visitors are allowed at one time in a patient room. The visitors may rotate out with other people during the day. One designated support person (adult) may remain overnight.  Due to an increase in RSV and influenza rates and associated hospitalizations, children ages 45 and under will not be able to visit patients in [facility]. Masks continue to be strongly recommended.     Preparing for Surgery with CHLORHEXIDINE GLUCONATE (CHG) Soap  Chlorhexidine Gluconate (CHG) Soap  o An antiseptic cleaner that kills germs and bonds with the skin to continue killing germs even after washing  o Used for showering the night before surgery and morning of surgery  Before surgery, you can play an important role by reducing the number of germs on your skin.  CHG (Chlorhexidine gluconate) soap is an antiseptic cleanser which kills germs and bonds with the skin to continue killing germs even after washing.  Please do not use if you have an allergy to CHG or antibacterial soaps. If your skin becomes reddened/irritated stop using the CHG.  1. Shower the NIGHT BEFORE SURGERY and the MORNING OF SURGERY with CHG soap.  2. If you choose to wash your hair, wash your hair first as usual with your normal shampoo.  3. After shampooing, rinse your hair and body thoroughly to remove the shampoo.  4.  Use CHG as you would any other liquid soap. You can apply CHG directly to the skin and wash gently with a scrungie or a clean washcloth.  5. Apply the CHG soap to your body only from the neck down. Do not use on open wounds or open sores. Avoid contact with your eyes, ears, mouth, and genitals (private parts). Wash face and genitals (private parts) with your normal soap.  6. Wash thoroughly, paying special attention to the area where your surgery will be performed.  7. Thoroughly rinse your body with warm water.  8. Do not shower/wash with your normal soap after using and rinsing off the CHG soap.  9. Pat yourself dry with a clean towel.  10. Wear clean pajamas to bed the night before surgery.  12. Place clean sheets on your bed the night of your first shower and do not sleep with pets.  13. Shower again with the CHG soap on the day of surgery prior to arriving at the hospital.  14. Do not apply any deodorants/lotions/powders.  15. Please wear clean clothes to the hospital.

## 2022-12-23 ENCOUNTER — Encounter: Payer: Self-pay | Admitting: Urgent Care

## 2022-12-23 ENCOUNTER — Encounter
Admission: RE | Admit: 2022-12-23 | Discharge: 2022-12-23 | Disposition: A | Discharge status: Home / Self Care | Payer: Medicare HMO | Source: Ambulatory Visit | Attending: Surgery | Admitting: Surgery

## 2022-12-23 DIAGNOSIS — R001 Bradycardia, unspecified: Secondary | ICD-10-CM | POA: Diagnosis not present

## 2022-12-23 DIAGNOSIS — I10 Essential (primary) hypertension: Secondary | ICD-10-CM | POA: Insufficient documentation

## 2022-12-23 DIAGNOSIS — Z01810 Encounter for preprocedural cardiovascular examination: Secondary | ICD-10-CM | POA: Diagnosis not present

## 2022-12-27 DIAGNOSIS — E1129 Type 2 diabetes mellitus with other diabetic kidney complication: Secondary | ICD-10-CM | POA: Diagnosis not present

## 2022-12-28 MED ORDER — CEFAZOLIN SODIUM-DEXTROSE 2-4 GM/100ML-% IV SOLN
2.0000 g | INTRAVENOUS | Status: AC
  Administered 2022-12-29: 2 g via INTRAVENOUS

## 2022-12-28 MED ORDER — CHLORHEXIDINE GLUCONATE 0.12 % MT SOLN: 15.0000 mL | Freq: Once | OROMUCOSAL | Status: AC

## 2022-12-28 MED ORDER — ORAL CARE MOUTH RINSE: 15.0000 mL | Freq: Once | OROMUCOSAL | Status: AC

## 2022-12-28 MED ORDER — GABAPENTIN 300 MG PO CAPS: 300.0000 mg | ORAL_CAPSULE | ORAL | Status: AC

## 2022-12-28 MED ORDER — INDOCYANINE GREEN 25 MG IV SOLR
1.2500 mg | Freq: Once | INTRAVENOUS | Status: AC
  Administered 2022-12-29: 1.25 mg via INTRAVENOUS
  Filled 2022-12-28: qty 0.5

## 2022-12-28 MED ORDER — CHLORHEXIDINE GLUCONATE CLOTH 2 % EX PADS: 6.0000 | MEDICATED_PAD | Freq: Once | CUTANEOUS | Status: DC

## 2022-12-28 MED ORDER — CELECOXIB 200 MG PO CAPS: 200.0000 mg | ORAL_CAPSULE | ORAL | Status: AC

## 2022-12-28 MED ORDER — FAMOTIDINE 20 MG PO TABS: 20.0000 mg | ORAL_TABLET | Freq: Once | ORAL | Status: AC

## 2022-12-28 MED ORDER — ACETAMINOPHEN 500 MG PO TABS: 1000.0000 mg | ORAL_TABLET | ORAL | Status: AC

## 2022-12-28 MED ORDER — LACTATED RINGERS IV SOLN: INTRAVENOUS | Status: DC

## 2022-12-29 ENCOUNTER — Encounter: Payer: Self-pay | Admitting: Surgery

## 2022-12-29 ENCOUNTER — Ambulatory Visit
Admission: RE | Admit: 2022-12-29 | Discharge: 2022-12-29 | Disposition: A | Discharge status: Home / Self Care | Payer: BC Managed Care – PPO | Source: Ambulatory Visit | Attending: Surgery | Admitting: Surgery

## 2022-12-29 ENCOUNTER — Ambulatory Visit: Payer: BC Managed Care – PPO | Admitting: Urgent Care

## 2022-12-29 ENCOUNTER — Encounter
Admission: RE | Disposition: A | Discharge status: Home / Self Care | Payer: Self-pay | Source: Ambulatory Visit | Attending: Surgery

## 2022-12-29 ENCOUNTER — Other Ambulatory Visit: Payer: Self-pay

## 2022-12-29 ENCOUNTER — Ambulatory Visit: Payer: BC Managed Care – PPO | Admitting: Certified Registered"

## 2022-12-29 DIAGNOSIS — E119 Type 2 diabetes mellitus without complications: Secondary | ICD-10-CM | POA: Insufficient documentation

## 2022-12-29 DIAGNOSIS — E1111 Type 2 diabetes mellitus with ketoacidosis with coma: Secondary | ICD-10-CM

## 2022-12-29 DIAGNOSIS — M1990 Unspecified osteoarthritis, unspecified site: Secondary | ICD-10-CM | POA: Insufficient documentation

## 2022-12-29 DIAGNOSIS — Z79899 Other long term (current) drug therapy: Secondary | ICD-10-CM | POA: Insufficient documentation

## 2022-12-29 DIAGNOSIS — K8020 Calculus of gallbladder without cholecystitis without obstruction: Secondary | ICD-10-CM | POA: Diagnosis present

## 2022-12-29 DIAGNOSIS — G4730 Sleep apnea, unspecified: Secondary | ICD-10-CM | POA: Insufficient documentation

## 2022-12-29 DIAGNOSIS — Z794 Long term (current) use of insulin: Secondary | ICD-10-CM | POA: Diagnosis not present

## 2022-12-29 DIAGNOSIS — K851 Biliary acute pancreatitis without necrosis or infection: Secondary | ICD-10-CM | POA: Diagnosis not present

## 2022-12-29 DIAGNOSIS — F0390 Unspecified dementia without behavioral disturbance: Secondary | ICD-10-CM | POA: Insufficient documentation

## 2022-12-29 DIAGNOSIS — K8010 Calculus of gallbladder with chronic cholecystitis without obstruction: Secondary | ICD-10-CM | POA: Insufficient documentation

## 2022-12-29 DIAGNOSIS — I10 Essential (primary) hypertension: Secondary | ICD-10-CM | POA: Diagnosis not present

## 2022-12-29 DIAGNOSIS — Z7984 Long term (current) use of oral hypoglycemic drugs: Secondary | ICD-10-CM | POA: Insufficient documentation

## 2022-12-29 LAB — GLUCOSE, CAPILLARY
Glucose-Capillary: 274 mg/dL — ABNORMAL HIGH (ref 70–99)
Glucose-Capillary: 380 mg/dL — ABNORMAL HIGH (ref 70–99)
Glucose-Capillary: 408 mg/dL — ABNORMAL HIGH (ref 70–99)
Glucose-Capillary: 303 mg/dL — ABNORMAL HIGH (ref 70–99)
Glucose-Capillary: 320 mg/dL — ABNORMAL HIGH (ref 70–99)

## 2022-12-29 SURGERY — CHOLECYSTECTOMY, ROBOT-ASSISTED, LAPAROSCOPIC
Anesthesia: General | Site: Abdomen

## 2022-12-29 MED ORDER — FENTANYL CITRATE (PF) 100 MCG/2ML IJ SOLN
INTRAMUSCULAR | Status: AC
  Filled 2022-12-29: qty 2

## 2022-12-29 MED ORDER — ROCURONIUM BROMIDE 100 MG/10ML IV SOLN
INTRAVENOUS | Status: DC | PRN
  Administered 2022-12-29: 50 mg via INTRAVENOUS

## 2022-12-29 MED ORDER — INSULIN DEGLUDEC 100 UNIT/ML ~~LOC~~ SOPN: 15.0000 [IU] | PEN_INJECTOR | Freq: Every day | SUBCUTANEOUS | 0 refills | Status: AC

## 2022-12-29 MED ORDER — CHLORHEXIDINE GLUCONATE 0.12 % MT SOLN
OROMUCOSAL | Status: AC
  Administered 2022-12-29: 15 mL via OROMUCOSAL
  Filled 2022-12-29: qty 15

## 2022-12-29 MED ORDER — INSULIN ASPART 100 UNIT/ML IJ SOLN
10.0000 [IU] | Freq: Once | INTRAMUSCULAR | Status: AC
  Administered 2022-12-29: 10 [IU] via SUBCUTANEOUS

## 2022-12-29 MED ORDER — FAMOTIDINE 20 MG PO TABS
ORAL_TABLET | ORAL | Status: AC
  Administered 2022-12-29: 20 mg via ORAL
  Filled 2022-12-29: qty 1

## 2022-12-29 MED ORDER — MIDAZOLAM HCL 2 MG/2ML IJ SOLN
INTRAMUSCULAR | Status: AC
  Filled 2022-12-29: qty 2

## 2022-12-29 MED ORDER — CELECOXIB 200 MG PO CAPS
ORAL_CAPSULE | ORAL | Status: AC
  Administered 2022-12-29: 200 mg via ORAL
  Filled 2022-12-29: qty 1

## 2022-12-29 MED ORDER — PHENYLEPHRINE 80 MCG/ML (10ML) SYRINGE FOR IV PUSH (FOR BLOOD PRESSURE SUPPORT)
PREFILLED_SYRINGE | INTRAVENOUS | Status: DC | PRN
  Administered 2022-12-29: 80 ug via INTRAVENOUS

## 2022-12-29 MED ORDER — LIDOCAINE-EPINEPHRINE (PF) 1 %-1:200000 IJ SOLN
INTRAMUSCULAR | Status: AC
  Filled 2022-12-29: qty 30

## 2022-12-29 MED ORDER — IBUPROFEN 800 MG PO TABS: 800.0000 mg | ORAL_TABLET | Freq: Three times a day (TID) | ORAL | 0 refills | Status: DC | PRN

## 2022-12-29 MED ORDER — OXYCODONE HCL 5 MG PO TABS
5.0000 mg | ORAL_TABLET | Freq: Once | ORAL | Status: AC
  Administered 2022-12-29: 5 mg via ORAL

## 2022-12-29 MED ORDER — INSULIN ASPART 100 UNIT/ML IJ SOLN
INTRAMUSCULAR | Status: AC
  Filled 2022-12-29: qty 1

## 2022-12-29 MED ORDER — ONDANSETRON HCL 4 MG/2ML IJ SOLN
INTRAMUSCULAR | Status: DC | PRN
  Administered 2022-12-29: 4 mg via INTRAVENOUS

## 2022-12-29 MED ORDER — FENTANYL CITRATE (PF) 100 MCG/2ML IJ SOLN
INTRAMUSCULAR | Status: AC
  Administered 2022-12-29: 25 ug via INTRAVENOUS
  Filled 2022-12-29: qty 2

## 2022-12-29 MED ORDER — INSULIN ASPART 100 UNIT/ML IV SOLN: 5.0000 [IU] | Freq: Once | INTRAVENOUS | Status: AC

## 2022-12-29 MED ORDER — ACETAMINOPHEN 500 MG PO TABS
ORAL_TABLET | ORAL | Status: AC
  Administered 2022-12-29: 1000 mg via ORAL
  Filled 2022-12-29: qty 2

## 2022-12-29 MED ORDER — PROPOFOL 10 MG/ML IV BOLUS
INTRAVENOUS | Status: DC | PRN
  Administered 2022-12-29: 150 mg via INTRAVENOUS

## 2022-12-29 MED ORDER — GABAPENTIN 300 MG PO CAPS
ORAL_CAPSULE | ORAL | Status: AC
  Administered 2022-12-29: 300 mg via ORAL
  Filled 2022-12-29: qty 1

## 2022-12-29 MED ORDER — TAMSULOSIN HCL 0.4 MG PO CAPS: 0.4000 mg | ORAL_CAPSULE | Freq: Every day | ORAL | Status: DC | PRN

## 2022-12-29 MED ORDER — INSULIN ASPART 100 UNIT/ML IJ SOLN
5.0000 [IU] | Freq: Once | INTRAMUSCULAR | Status: AC
  Administered 2022-12-29: 5 [IU] via SUBCUTANEOUS

## 2022-12-29 MED ORDER — INSULIN ASPART 100 UNIT/ML IJ SOLN
INTRAMUSCULAR | Status: AC
  Administered 2022-12-29: 5 [IU] via INTRAVENOUS
  Filled 2022-12-29: qty 1

## 2022-12-29 MED ORDER — LIDOCAINE-EPINEPHRINE (PF) 1 %-1:200000 IJ SOLN
INTRAMUSCULAR | Status: DC | PRN
  Administered 2022-12-29: 20 mL via SUBCUTANEOUS

## 2022-12-29 MED ORDER — INSULIN ASPART 100 UNIT/ML FLEXPEN: PEN_INJECTOR | SUBCUTANEOUS | Status: DC

## 2022-12-29 MED ORDER — ACETAMINOPHEN 325 MG PO TABS: 650.0000 mg | ORAL_TABLET | Freq: Three times a day (TID) | ORAL | 0 refills | Status: AC | PRN

## 2022-12-29 MED ORDER — MIDAZOLAM HCL 2 MG/2ML IJ SOLN
INTRAMUSCULAR | Status: DC | PRN
  Administered 2022-12-29: 2 mg via INTRAVENOUS

## 2022-12-29 MED ORDER — FENTANYL CITRATE (PF) 100 MCG/2ML IJ SOLN
25.0000 ug | INTRAMUSCULAR | Status: DC | PRN
  Administered 2022-12-29 (×5): 25 ug via INTRAVENOUS

## 2022-12-29 MED ORDER — CEFAZOLIN SODIUM-DEXTROSE 2-4 GM/100ML-% IV SOLN
INTRAVENOUS | Status: AC
  Filled 2022-12-29: qty 100

## 2022-12-29 MED ORDER — BUPIVACAINE HCL (PF) 0.5 % IJ SOLN
INTRAMUSCULAR | Status: AC
  Filled 2022-12-29: qty 30

## 2022-12-29 MED ORDER — LIDOCAINE HCL (CARDIAC) PF 100 MG/5ML IV SOSY
PREFILLED_SYRINGE | INTRAVENOUS | Status: DC | PRN
  Administered 2022-12-29: 60 mg via INTRAVENOUS

## 2022-12-29 MED ORDER — SUGAMMADEX SODIUM 200 MG/2ML IV SOLN
INTRAVENOUS | Status: DC | PRN
  Administered 2022-12-29: 200 mg via INTRAVENOUS

## 2022-12-29 MED ORDER — PROPOFOL 10 MG/ML IV BOLUS
INTRAVENOUS | Status: AC
  Filled 2022-12-29: qty 20

## 2022-12-29 MED ORDER — OXYCODONE HCL 5 MG PO TABS
ORAL_TABLET | ORAL | Status: AC
  Filled 2022-12-29: qty 1

## 2022-12-29 MED ORDER — DEXAMETHASONE SODIUM PHOSPHATE 10 MG/ML IJ SOLN
INTRAMUSCULAR | Status: DC | PRN
  Administered 2022-12-29: 5 mg via INTRAVENOUS

## 2022-12-29 MED ORDER — FENTANYL CITRATE (PF) 100 MCG/2ML IJ SOLN
INTRAMUSCULAR | Status: DC | PRN
  Administered 2022-12-29 (×2): 50 ug via INTRAVENOUS

## 2022-12-29 MED ORDER — OXYCODONE-ACETAMINOPHEN 5-325 MG PO TABS: 1.0000 | ORAL_TABLET | Freq: Three times a day (TID) | ORAL | 0 refills | Status: AC | PRN

## 2022-12-29 MED ORDER — 0.9 % SODIUM CHLORIDE (POUR BTL) OPTIME
TOPICAL | Status: DC | PRN
  Administered 2022-12-29: 500 mL

## 2022-12-29 MED ORDER — ONDANSETRON HCL 4 MG/2ML IJ SOLN: 4.0000 mg | Freq: Once | INTRAMUSCULAR | Status: DC | PRN

## 2022-12-29 MED ORDER — GLYCOPYRROLATE 0.2 MG/ML IJ SOLN
INTRAMUSCULAR | Status: DC | PRN
  Administered 2022-12-29: .2 mg via INTRAVENOUS

## 2022-12-29 SURGICAL SUPPLY — 59 items
ADH SKN CLS APL DERMABOND .7 (GAUZE/BANDAGES/DRESSINGS) ×1
ANCHOR TIS RET SYS 235ML (MISCELLANEOUS) ×1 IMPLANT
BAG PRESSURE INF REUSE 1000 (BAG) IMPLANT
BAG TISS RTRVL C235 10X14 (MISCELLANEOUS) ×1
BLADE SURG SZ11 CARB STEEL (BLADE) ×1 IMPLANT
CANNULA REDUC XI 12-8 STAPL (CANNULA) ×1
CANNULA REDUCER 12-8 DVNC XI (CANNULA) ×1 IMPLANT
CATH REDDICK CHOLANGI 4FR 50CM (CATHETERS) IMPLANT
CLIP LIGATING HEMO O LOK GREEN (MISCELLANEOUS) ×1 IMPLANT
DERMABOND ADVANCED .7 DNX12 (GAUZE/BANDAGES/DRESSINGS) ×1 IMPLANT
DRAPE ARM DVNC X/XI (DISPOSABLE) ×4 IMPLANT
DRAPE C-ARM XRAY 36X54 (DRAPES) IMPLANT
DRAPE COLUMN DVNC XI (DISPOSABLE) ×1 IMPLANT
DRAPE DA VINCI XI ARM (DISPOSABLE) ×4
DRAPE DA VINCI XI COLUMN (DISPOSABLE) ×1
ELECT CAUTERY BLADE 6.4 (BLADE) ×1 IMPLANT
ELECT REM PT RETURN 9FT ADLT (ELECTROSURGICAL) ×1
ELECTRODE REM PT RTRN 9FT ADLT (ELECTROSURGICAL) ×1 IMPLANT
GLOVE BIOGEL PI IND STRL 7.0 (GLOVE) ×2 IMPLANT
GLOVE SURG SYN 6.5 ES PF (GLOVE) ×2 IMPLANT
GLOVE SURG SYN 6.5 PF PI (GLOVE) ×2 IMPLANT
GOWN STRL REUS W/ TWL LRG LVL3 (GOWN DISPOSABLE) ×3 IMPLANT
GOWN STRL REUS W/TWL LRG LVL3 (GOWN DISPOSABLE) ×3
GRASPER SUT TROCAR 14GX15 (MISCELLANEOUS) IMPLANT
IRRIGATOR SUCT 8 DISP DVNC XI (IRRIGATION / IRRIGATOR) IMPLANT
IRRIGATOR SUCTION 8MM XI DISP (IRRIGATION / IRRIGATOR)
IV NS 1000ML (IV SOLUTION)
IV NS 1000ML BAXH (IV SOLUTION) IMPLANT
KIT TURNOVER KIT A (KITS) ×1 IMPLANT
LABEL OR SOLS (LABEL) ×1 IMPLANT
MANIFOLD NEPTUNE II (INSTRUMENTS) ×1 IMPLANT
NDL INSUFFLATION 14GA 120MM (NEEDLE) ×1 IMPLANT
NEEDLE HYPO 22GX1.5 SAFETY (NEEDLE) ×1 IMPLANT
NEEDLE INSUFFLATION 14GA 120MM (NEEDLE) ×1 IMPLANT
NS IRRIG 500ML POUR BTL (IV SOLUTION) ×1 IMPLANT
OBTURATOR OPTICAL STANDARD 8MM (TROCAR) ×1
OBTURATOR OPTICAL STND 8 DVNC (TROCAR) ×1
OBTURATOR OPTICALSTD 8 DVNC (TROCAR) ×1 IMPLANT
PACK LAP CHOLECYSTECTOMY (MISCELLANEOUS) ×1 IMPLANT
PENCIL SMOKE EVACUATOR (MISCELLANEOUS) ×1 IMPLANT
SEAL CANN UNIV 5-8 DVNC XI (MISCELLANEOUS) ×3 IMPLANT
SEAL XI 5MM-8MM UNIVERSAL (MISCELLANEOUS) ×3
SET TUBE SMOKE EVAC HIGH FLOW (TUBING) ×1 IMPLANT
SOL ELECTROSURG ANTI STICK (MISCELLANEOUS) ×1
SOLUTION ELECTROSURG ANTI STCK (MISCELLANEOUS) ×1 IMPLANT
SPIKE FLUID TRANSFER (MISCELLANEOUS) ×2 IMPLANT
STAPLER CANNULA SEAL DVNC XI (STAPLE) ×1 IMPLANT
STAPLER CANNULA SEAL XI (STAPLE) ×1
SUT MNCRL 4-0 (SUTURE) ×1
SUT MNCRL 4-0 27XMFL (SUTURE) ×1
SUT VIC AB 3-0 SH 27 (SUTURE) ×1
SUT VIC AB 3-0 SH 27X BRD (SUTURE) IMPLANT
SUT VICRYL 0 UR6 27IN ABS (SUTURE) ×1 IMPLANT
SUT VICRYL 4-0 PS2 18IN ABS (SUTURE) IMPLANT
SUTURE MNCRL 4-0 27XMF (SUTURE) ×2 IMPLANT
SYR 30ML LL (SYRINGE) IMPLANT
SYSTEM WECK SHIELD CLOSURE (TROCAR) IMPLANT
TRAP FLUID SMOKE EVACUATOR (MISCELLANEOUS) ×1 IMPLANT
WATER STERILE IRR 500ML POUR (IV SOLUTION) ×1 IMPLANT

## 2022-12-29 NOTE — Anesthesia Preprocedure Evaluation (Signed)
Anesthesia Evaluation  Patient identified by MRN, date of birth, ID band Patient awake    Reviewed: Allergy & Precautions, H&P , NPO status , Patient's Chart, lab work & pertinent test results, reviewed documented beta blocker date and time   Airway Mallampati: II  TM Distance: >3 FB Neck ROM: full    Dental  (+) Teeth Intact   Pulmonary sleep apnea and Continuous Positive Airway Pressure Ventilation    Pulmonary exam normal        Cardiovascular Exercise Tolerance: Good hypertension, On Medications negative cardio ROS Normal cardiovascular exam Rhythm:regular Rate:Normal     Neuro/Psych  PSYCHIATRIC DISORDERS     Dementia negative neurological ROS     GI/Hepatic negative GI ROS, Neg liver ROS,,,  Endo/Other  negative endocrine ROSdiabetes, Well Controlled, Type 2    Renal/GU negative Renal ROS  negative genitourinary   Musculoskeletal   Abdominal   Peds  Hematology negative hematology ROS (+)   Anesthesia Other Findings Past Medical History: No date: Arthritis No date: Dementia (HCC) No date: Diabetes mellitus without complication (HCC) No date: Hypertension No date: Sleep apnea Past Surgical History: No date: CERVICAL SPINE SURGERY     Comment:  plate and rods No date: COLONOSCOPY No date: HERNIA REPAIR No date: TONSILLECTOMY BMI    Body Mass Index: 2.07 kg/m     Reproductive/Obstetrics negative OB ROS                             Anesthesia Physical Anesthesia Plan  ASA: 3  Anesthesia Plan: General ETT   Post-op Pain Management:    Induction:   PONV Risk Score and Plan: 3  Airway Management Planned:   Additional Equipment:   Intra-op Plan:   Post-operative Plan:   Informed Consent: I have reviewed the patients History and Physical, chart, labs and discussed the procedure including the risks, benefits and alternatives for the proposed anesthesia with the  patient or authorized representative who has indicated his/her understanding and acceptance.     Dental Advisory Given  Plan Discussed with: CRNA  Anesthesia Plan Comments:        Anesthesia Quick Evaluation

## 2022-12-29 NOTE — Transfer of Care (Signed)
Immediate Anesthesia Transfer of Care Note  Patient: Alan Nunez  Procedure(s) Performed: XI ROBOTIC ASSISTED LAPAROSCOPIC CHOLECYSTECTOMY (Abdomen) INDOCYANINE GREEN FLUORESCENCE IMAGING (ICG) (Abdomen)  Patient Location: PACU  Anesthesia Type:General  Level of Consciousness: drowsy  Airway & Oxygen Therapy: Patient Spontanous Breathing and Patient connected to face mask oxygen  Post-op Assessment: Report given to RN  Post vital signs: stable  Last Vitals:  Vitals Value Taken Time  BP 157/91 12/29/22 0942  Temp    Pulse 59 12/29/22 0944  Resp 17 12/29/22 0944  SpO2 100 % 12/29/22 0944  Vitals shown include unvalidated device data.  Last Pain:  Vitals:   12/29/22 0727  TempSrc: Temporal  PainSc: 0-No pain         Complications: No notable events documented.

## 2022-12-29 NOTE — Anesthesia Procedure Notes (Signed)
Procedure Name: Intubation Date/Time: 12/29/2022 8:38 AM  Performed by: Lerry Liner, CRNAPre-anesthesia Checklist: Patient identified, Emergency Drugs available, Suction available and Patient being monitored Patient Re-evaluated:Patient Re-evaluated prior to induction Oxygen Delivery Method: Circle system utilized Preoxygenation: Pre-oxygenation with 100% oxygen Induction Type: IV induction Ventilation: Mask ventilation without difficulty Laryngoscope Size: McGraph and 3 Grade View: Grade I Tube type: Oral Tube size: 7.5 mm Number of attempts: 1 Airway Equipment and Method: Stylet and Oral airway Placement Confirmation: ETT inserted through vocal cords under direct vision, positive ETCO2 and breath sounds checked- equal and bilateral Secured at: 22 cm Tube secured with: Tape Dental Injury: Teeth and Oropharynx as per pre-operative assessment

## 2022-12-29 NOTE — Discharge Instructions (Addendum)
Laparoscopic Cholecystectomy, Care After This sheet gives you information about how to care for yourself after your procedure. Your doctor may also give you more specific instructions. If you have problems or questions, contact your doctor. Follow these instructions at home: Care for cuts from surgery (incisions)  Follow instructions from your doctor about how to take care of your cuts from surgery. Make sure you: Wash your hands with soap and water before you change your bandage (dressing). If you cannot use soap and water, use hand sanitizer. Change your bandage as told by your doctor. Leave stitches (sutures), skin glue, or skin tape (adhesive) strips in place. They may need to stay in place for 2 weeks or longer. If tape strips get loose and curl up, you may trim the loose edges. Do not remove tape strips completely unless your doctor says it is okay. Do not take baths, swim, or use a hot tub until your doctor says it is okay. OK TO SHOWER 24HRS AFTER YOUR SURGERY.  Check your surgical cut area every day for signs of infection. Check for: More redness, swelling, or pain. More fluid or blood. Warmth. Pus or a bad smell. Activity Do not drive or use heavy machinery while taking prescription pain medicine. Do not play contact sports until your doctor says it is okay. Do not drive for 24 hours if you were given a medicine to help you relax (sedative). Rest as needed. Do not return to work or school until your doctor says it is okay. General instructions  tylenol and advil as needed for discomfort.  Please alternate between the two every four hours as needed for pain.    Use narcotics, if prescribed, only when tylenol and motrin is not enough to control pain.  325-'650mg'$  every 8hrs to max of '3000mg'$ /24hrs (including the '325mg'$  in every norco dose) for the tylenol.    Advil up to '800mg'$  per dose every 8hrs as needed for pain.   To prevent or treat constipation while you are taking prescription  pain medicine, your doctor may recommend that you: Drink enough fluid to keep your pee (urine) clear or pale yellow. Take over-the-counter or prescription medicines. Eat foods that are high in fiber, such as fresh fruits and vegetables, whole grains, and beans. Limit foods that are high in fat and processed sugars, such as fried and sweet foods. Contact a doctor if: You develop a rash. You have more redness, swelling, or pain around your surgical cuts. You have more fluid or blood coming from your surgical cuts. Your surgical cuts feel warm to the touch. You have pus or a bad smell coming from your surgical cuts. You have a fever. One or more of your surgical cuts breaks open. You have trouble breathing. You have chest pain. You have pain that is getting worse in your shoulders. You faint or feel dizzy when you stand. You have very bad pain in your belly (abdomen). You are sick to your stomach (nauseous) for more than one day. You have throwing up (vomiting) that lasts for more than one day. You have leg pain. This information is not intended to replace advice given to you by your health care provider. Make sure you discuss any questions you have with your health care provider. Document Released: 07/27/2008 Document Revised: 05/08/2016 Document Reviewed: 04/05/2016 Elsevier Interactive Patient Education  2019 [place]  The drugs that you were given will stay in your system until tomorrow  so for the next 24 hours you should not:  Drive an automobile Make any legal decisions Drink any alcoholic beverage  You may resume regular meals tomorrow.  Today it is better to start with liquids and gradually work up to solid foods.  You may eat anything you prefer, but it is better to start with liquids, then soup and crackers, and gradually work up to solid foods.  Please notify your doctor immediately if you have any unusual bleeding,  trouble breathing, redness and pain at the surgery site, drainage, fever, or pain not relieved by medication.  Additional Instructions:  Please contact your physician with any problems or Same Day Surgery at [phone], Monday through Friday 6 am to 4 pm, or [place] at [facility] number at [phone].

## 2022-12-29 NOTE — Interval H&P Note (Signed)
History and Physical Interval Note:  12/29/2022 8:22 AM  Alan Nunez  has presented today for surgery, with the diagnosis of Gallstone pancreatitis K85.10.  The various methods of treatment have been discussed with the patient and family. After consideration of risks, benefits and other options for treatment, the patient has consented to  Procedure(s): XI ROBOTIC ASSISTED LAPAROSCOPIC CHOLECYSTECTOMY (N/A) [name] (ICG) (N/A) as a surgical intervention.  The patient's history has been reviewed, patient examined, no change in status, stable for surgery.  I have reviewed the patient's chart and labs.  Questions were answered to the patient's satisfaction.     [name]

## 2022-12-29 NOTE — Anesthesia Postprocedure Evaluation (Signed)
Anesthesia Post Note  Patient: Alan Nunez  Procedure(s) Performed: XI ROBOTIC ASSISTED LAPAROSCOPIC CHOLECYSTECTOMY (Abdomen) INDOCYANINE GREEN FLUORESCENCE IMAGING (ICG) (Abdomen)  Patient location during evaluation: PACU Anesthesia Type: General Level of consciousness: awake and alert Pain management: pain level controlled Vital Signs Assessment: post-procedure vital signs reviewed and stable Respiratory status: spontaneous breathing, nonlabored ventilation, respiratory function stable and patient connected to nasal cannula oxygen Cardiovascular status: blood pressure returned to baseline and stable Postop Assessment: no apparent nausea or vomiting Anesthetic complications: no   No notable events documented.   Last Vitals:  Vitals:   12/29/22 1115 12/29/22 1130  BP: (!) 140/79 130/72  Pulse: 60 (!) 59  Resp: 14 13  Temp:    SpO2: 98% 100%    Last Pain:  Vitals:   12/29/22 1115  TempSrc:   PainSc: [place] Samia Kukla

## 2022-12-29 NOTE — Op Note (Signed)
Preoperative diagnosis:  cholelithiasis and pancreatitis  Postoperative diagnosis: same as above  Procedure: Robotic assisted Laparoscopic Cholecystectomy.   Anesthesia: GETA   Surgeon: Benjamine Sprague  Specimen: Gallbladder  Complications: None  EBL: 62m  Wound Classification: Clean Contaminated  Indications: see HPI  Findings: Critical view of safety noted Cystic duct and artery identified, ligated and divided, clips remained intact at end of procedure Adequate hemostasis  Description of procedure:  The patient was placed on the operating table in the supine position. SCDs placed, pre-op abx administered.  General anesthesia was induced and OG tube placed by anesthesia. A time-out was completed verifying correct patient, procedure, site, positioning, and implant(s) and/or special equipment prior to beginning this procedure. The abdomen was prepped and draped in the usual sterile fashion.    Veress needle was placed at the Palmer's point and insufflation was started after confirming a positive saline drop test and no immediate increase in abdominal pressure.  After reaching 15 mm, the Veress needle was removed and a 8 mm port was placed via optiview technique under umbilicus measured 20000000from gallbladder.  The abdomen was inspected and no abnormalities or injuries were found.  Under direct vision, ports were placed in the following locations: One 12 mm patient left of the umbilicus, 8cm from the optiviewed port, one 8 mm port placed to the patient right of the umbilical port 8 cm apart.  1 additional 8 mm port placed lateral to the 167mport.  Once ports were placed, The table was placed in the reverse Trendelenburg position with the right side up. The Xi platform was brought into the operative field and docked to the ports successfully.  An endoscope was placed through the umbilical port, fenestrated grasper through the adjacent patient right port, prograsp to the far patient left port,  and then a hook cautery in the left port.  The dome of the gallbladder was grasped with prograsp, passed and retracted over the dome of the liver. Adhesions between the gallbladder and omentum, duodenum and transverse colon were lysed via hook cautery. The infundibulum was grasped with the fenestrated grasper and retracted toward the right lower quadrant. This maneuver exposed Calot's triangle. The peritoneum overlying the gallbladder infundibulum was then dissected  and the cystic duct and cystic artery identified.  Critical view of safety with the liver bed clearly visible behind the duct and artery with no additional structures noted.  The cystic duct and cystic artery clipped and divided close to the gallbladder.     The gallbladder was then dissected from its peritoneal and liver bed attachments by electrocautery. Hemostasis was checked prior to removing the hook cautery and the Endo Catch bag was then placed through the 12 mm port and the gallbladder was removed.  The gallbladder was passed off the table as a specimen. There was no evidence of bleeding from the gallbladder fossa or cystic artery or leakage of the bile from the cystic duct stump. The 12 mm port site closed with PMI using 0 vicryl under direct vision.  Abdomen desufflated and secondary trocars were removed under direct vision. No bleeding was noted. All skin incisions then closed with subcuticular sutures of 4-0 monocryl and dressed with topical skin adhesive. The orogastric tube was removed and patient extubated.  The patient tolerated the procedure well and was taken to the postanesthesia care unit in stable condition.  All sponge and instrument count correct at end of procedure.

## 2022-12-29 NOTE — Progress Notes (Signed)
Glucose down to 320 in postop. Dr Lysle Pearl and Dr Andree Elk are comfortable with discharging to home at this point since it is lower. Talked with wife and advised her to reach out to his endocrinologist concerning high glucose levels. She states that she will. VSS.

## 2022-12-30 DIAGNOSIS — F0390 Unspecified dementia without behavioral disturbance: Secondary | ICD-10-CM | POA: Diagnosis not present

## 2022-12-30 DIAGNOSIS — Z0000 Encounter for general adult medical examination without abnormal findings: Secondary | ICD-10-CM | POA: Diagnosis not present

## 2022-12-30 DIAGNOSIS — Z125 Encounter for screening for malignant neoplasm of prostate: Secondary | ICD-10-CM | POA: Diagnosis not present

## 2022-12-30 DIAGNOSIS — E1129 Type 2 diabetes mellitus with other diabetic kidney complication: Secondary | ICD-10-CM | POA: Diagnosis not present

## 2022-12-30 DIAGNOSIS — Z794 Long term (current) use of insulin: Secondary | ICD-10-CM | POA: Diagnosis not present

## 2022-12-30 DIAGNOSIS — Z1159 Encounter for screening for other viral diseases: Secondary | ICD-10-CM | POA: Diagnosis not present

## 2022-12-30 DIAGNOSIS — E1169 Type 2 diabetes mellitus with other specified complication: Secondary | ICD-10-CM | POA: Diagnosis not present

## 2022-12-30 DIAGNOSIS — I10 Essential (primary) hypertension: Secondary | ICD-10-CM | POA: Diagnosis not present

## 2022-12-30 DIAGNOSIS — R809 Proteinuria, unspecified: Secondary | ICD-10-CM | POA: Diagnosis not present

## 2022-12-30 LAB — SURGICAL PATHOLOGY

## 2023-01-11 DIAGNOSIS — Z125 Encounter for screening for malignant neoplasm of prostate: Secondary | ICD-10-CM | POA: Diagnosis not present

## 2023-01-11 DIAGNOSIS — E1129 Type 2 diabetes mellitus with other diabetic kidney complication: Secondary | ICD-10-CM | POA: Diagnosis not present

## 2023-01-11 DIAGNOSIS — Z1159 Encounter for screening for other viral diseases: Secondary | ICD-10-CM | POA: Diagnosis not present

## 2023-01-11 DIAGNOSIS — R809 Proteinuria, unspecified: Secondary | ICD-10-CM | POA: Diagnosis not present

## 2023-01-11 DIAGNOSIS — Z794 Long term (current) use of insulin: Secondary | ICD-10-CM | POA: Diagnosis not present

## 2023-01-12 DIAGNOSIS — R809 Proteinuria, unspecified: Secondary | ICD-10-CM | POA: Diagnosis not present

## 2023-01-12 DIAGNOSIS — E1159 Type 2 diabetes mellitus with other circulatory complications: Secondary | ICD-10-CM | POA: Diagnosis not present

## 2023-01-12 DIAGNOSIS — E785 Hyperlipidemia, unspecified: Secondary | ICD-10-CM | POA: Diagnosis not present

## 2023-01-12 DIAGNOSIS — E1169 Type 2 diabetes mellitus with other specified complication: Secondary | ICD-10-CM | POA: Diagnosis not present

## 2023-01-12 DIAGNOSIS — I152 Hypertension secondary to endocrine disorders: Secondary | ICD-10-CM | POA: Diagnosis not present

## 2023-01-12 DIAGNOSIS — Z794 Long term (current) use of insulin: Secondary | ICD-10-CM | POA: Diagnosis not present

## 2023-01-12 DIAGNOSIS — E1129 Type 2 diabetes mellitus with other diabetic kidney complication: Secondary | ICD-10-CM | POA: Diagnosis not present

## 2023-01-25 DIAGNOSIS — E1129 Type 2 diabetes mellitus with other diabetic kidney complication: Secondary | ICD-10-CM | POA: Diagnosis not present

## 2023-04-04 DIAGNOSIS — E119 Type 2 diabetes mellitus without complications: Secondary | ICD-10-CM | POA: Diagnosis not present

## 2023-04-04 DIAGNOSIS — Z961 Presence of intraocular lens: Secondary | ICD-10-CM | POA: Diagnosis not present

## 2023-04-20 DIAGNOSIS — E1129 Type 2 diabetes mellitus with other diabetic kidney complication: Secondary | ICD-10-CM | POA: Diagnosis not present

## 2023-04-20 DIAGNOSIS — Z794 Long term (current) use of insulin: Secondary | ICD-10-CM | POA: Diagnosis not present

## 2023-04-20 DIAGNOSIS — E1169 Type 2 diabetes mellitus with other specified complication: Secondary | ICD-10-CM | POA: Diagnosis not present

## 2023-04-20 DIAGNOSIS — E785 Hyperlipidemia, unspecified: Secondary | ICD-10-CM | POA: Diagnosis not present

## 2023-04-20 DIAGNOSIS — R809 Proteinuria, unspecified: Secondary | ICD-10-CM | POA: Diagnosis not present

## 2023-04-20 DIAGNOSIS — E1159 Type 2 diabetes mellitus with other circulatory complications: Secondary | ICD-10-CM | POA: Diagnosis not present

## 2023-04-20 DIAGNOSIS — I152 Hypertension secondary to endocrine disorders: Secondary | ICD-10-CM | POA: Diagnosis not present

## 2023-05-13 DIAGNOSIS — M25562 Pain in left knee: Secondary | ICD-10-CM | POA: Diagnosis not present

## 2023-05-13 DIAGNOSIS — R809 Proteinuria, unspecified: Secondary | ICD-10-CM | POA: Diagnosis not present

## 2023-05-13 DIAGNOSIS — G8929 Other chronic pain: Secondary | ICD-10-CM | POA: Diagnosis not present

## 2023-05-13 DIAGNOSIS — E1129 Type 2 diabetes mellitus with other diabetic kidney complication: Secondary | ICD-10-CM | POA: Diagnosis not present

## 2023-05-13 DIAGNOSIS — Z794 Long term (current) use of insulin: Secondary | ICD-10-CM | POA: Diagnosis not present

## 2023-05-13 DIAGNOSIS — I10 Essential (primary) hypertension: Secondary | ICD-10-CM | POA: Diagnosis not present

## 2023-05-13 DIAGNOSIS — M542 Cervicalgia: Secondary | ICD-10-CM | POA: Diagnosis not present

## 2023-05-23 DIAGNOSIS — G8929 Other chronic pain: Secondary | ICD-10-CM | POA: Diagnosis not present

## 2023-05-23 DIAGNOSIS — M1712 Unilateral primary osteoarthritis, left knee: Secondary | ICD-10-CM | POA: Diagnosis not present

## 2023-07-11 DIAGNOSIS — E1159 Type 2 diabetes mellitus with other circulatory complications: Secondary | ICD-10-CM | POA: Diagnosis not present

## 2023-07-11 DIAGNOSIS — Z23 Encounter for immunization: Secondary | ICD-10-CM | POA: Diagnosis not present

## 2023-07-11 DIAGNOSIS — I152 Hypertension secondary to endocrine disorders: Secondary | ICD-10-CM | POA: Diagnosis not present

## 2023-07-11 DIAGNOSIS — R0683 Snoring: Secondary | ICD-10-CM | POA: Diagnosis not present

## 2023-07-11 DIAGNOSIS — F0280 Dementia in other diseases classified elsewhere without behavioral disturbance: Secondary | ICD-10-CM | POA: Diagnosis not present

## 2023-07-11 DIAGNOSIS — G300 Alzheimer's disease with early onset: Secondary | ICD-10-CM | POA: Diagnosis not present

## 2023-07-13 DIAGNOSIS — H6123 Impacted cerumen, bilateral: Secondary | ICD-10-CM | POA: Diagnosis not present

## 2023-07-13 DIAGNOSIS — H903 Sensorineural hearing loss, bilateral: Secondary | ICD-10-CM | POA: Diagnosis not present

## 2023-07-13 DIAGNOSIS — H6983 Other specified disorders of Eustachian tube, bilateral: Secondary | ICD-10-CM | POA: Diagnosis not present

## 2023-07-13 DIAGNOSIS — H9313 Tinnitus, bilateral: Secondary | ICD-10-CM | POA: Diagnosis not present

## 2023-09-06 DIAGNOSIS — Z794 Long term (current) use of insulin: Secondary | ICD-10-CM | POA: Diagnosis not present

## 2023-09-06 DIAGNOSIS — E1159 Type 2 diabetes mellitus with other circulatory complications: Secondary | ICD-10-CM | POA: Diagnosis not present

## 2023-09-06 DIAGNOSIS — E1169 Type 2 diabetes mellitus with other specified complication: Secondary | ICD-10-CM | POA: Diagnosis not present

## 2023-09-06 DIAGNOSIS — R809 Proteinuria, unspecified: Secondary | ICD-10-CM | POA: Diagnosis not present

## 2023-09-06 DIAGNOSIS — E785 Hyperlipidemia, unspecified: Secondary | ICD-10-CM | POA: Diagnosis not present

## 2023-09-06 DIAGNOSIS — I152 Hypertension secondary to endocrine disorders: Secondary | ICD-10-CM | POA: Diagnosis not present

## 2023-09-06 DIAGNOSIS — E1129 Type 2 diabetes mellitus with other diabetic kidney complication: Secondary | ICD-10-CM | POA: Diagnosis not present

## 2023-10-11 DIAGNOSIS — G8929 Other chronic pain: Secondary | ICD-10-CM | POA: Diagnosis not present

## 2023-10-11 DIAGNOSIS — I10 Essential (primary) hypertension: Secondary | ICD-10-CM | POA: Diagnosis not present

## 2023-10-11 DIAGNOSIS — R809 Proteinuria, unspecified: Secondary | ICD-10-CM | POA: Diagnosis not present

## 2023-10-11 DIAGNOSIS — Z794 Long term (current) use of insulin: Secondary | ICD-10-CM | POA: Diagnosis not present

## 2023-10-11 DIAGNOSIS — M25562 Pain in left knee: Secondary | ICD-10-CM | POA: Diagnosis not present

## 2023-10-11 DIAGNOSIS — E1129 Type 2 diabetes mellitus with other diabetic kidney complication: Secondary | ICD-10-CM | POA: Diagnosis not present

## 2023-10-11 DIAGNOSIS — M542 Cervicalgia: Secondary | ICD-10-CM | POA: Diagnosis not present

## 2023-10-11 DIAGNOSIS — H6691 Otitis media, unspecified, right ear: Secondary | ICD-10-CM | POA: Diagnosis not present

## 2023-10-11 DIAGNOSIS — Z125 Encounter for screening for malignant neoplasm of prostate: Secondary | ICD-10-CM | POA: Diagnosis not present

## 2023-11-25 DIAGNOSIS — H6983 Other specified disorders of Eustachian tube, bilateral: Secondary | ICD-10-CM | POA: Diagnosis not present

## 2023-11-25 DIAGNOSIS — H9313 Tinnitus, bilateral: Secondary | ICD-10-CM | POA: Diagnosis not present

## 2023-11-25 DIAGNOSIS — H903 Sensorineural hearing loss, bilateral: Secondary | ICD-10-CM | POA: Diagnosis not present

## 2023-11-25 DIAGNOSIS — H6123 Impacted cerumen, bilateral: Secondary | ICD-10-CM | POA: Diagnosis not present

## 2023-12-05 DIAGNOSIS — L570 Actinic keratosis: Secondary | ICD-10-CM | POA: Diagnosis not present

## 2023-12-05 DIAGNOSIS — L308 Other specified dermatitis: Secondary | ICD-10-CM | POA: Diagnosis not present

## 2023-12-05 DIAGNOSIS — D485 Neoplasm of uncertain behavior of skin: Secondary | ICD-10-CM | POA: Diagnosis not present

## 2023-12-05 DIAGNOSIS — D0461 Carcinoma in situ of skin of right upper limb, including shoulder: Secondary | ICD-10-CM | POA: Diagnosis not present

## 2023-12-05 DIAGNOSIS — L853 Xerosis cutis: Secondary | ICD-10-CM | POA: Diagnosis not present

## 2023-12-05 DIAGNOSIS — Q800 Ichthyosis vulgaris: Secondary | ICD-10-CM | POA: Diagnosis not present

## 2024-01-12 DIAGNOSIS — R0683 Snoring: Secondary | ICD-10-CM | POA: Diagnosis not present

## 2024-01-12 DIAGNOSIS — G300 Alzheimer's disease with early onset: Secondary | ICD-10-CM | POA: Diagnosis not present

## 2024-01-12 DIAGNOSIS — F0280 Dementia in other diseases classified elsewhere without behavioral disturbance: Secondary | ICD-10-CM | POA: Diagnosis not present

## 2024-04-25 DIAGNOSIS — E1169 Type 2 diabetes mellitus with other specified complication: Secondary | ICD-10-CM | POA: Diagnosis not present

## 2024-04-25 DIAGNOSIS — R809 Proteinuria, unspecified: Secondary | ICD-10-CM | POA: Diagnosis not present

## 2024-04-25 DIAGNOSIS — E1159 Type 2 diabetes mellitus with other circulatory complications: Secondary | ICD-10-CM | POA: Diagnosis not present

## 2024-04-25 DIAGNOSIS — E785 Hyperlipidemia, unspecified: Secondary | ICD-10-CM | POA: Diagnosis not present

## 2024-04-25 DIAGNOSIS — E1129 Type 2 diabetes mellitus with other diabetic kidney complication: Secondary | ICD-10-CM | POA: Diagnosis not present

## 2024-04-25 DIAGNOSIS — I152 Hypertension secondary to endocrine disorders: Secondary | ICD-10-CM | POA: Diagnosis not present

## 2024-04-25 DIAGNOSIS — Z794 Long term (current) use of insulin: Secondary | ICD-10-CM | POA: Diagnosis not present

## 2024-05-09 DIAGNOSIS — I10 Essential (primary) hypertension: Secondary | ICD-10-CM | POA: Diagnosis not present

## 2024-05-09 DIAGNOSIS — Z1211 Encounter for screening for malignant neoplasm of colon: Secondary | ICD-10-CM | POA: Diagnosis not present

## 2024-05-09 DIAGNOSIS — E119 Type 2 diabetes mellitus without complications: Secondary | ICD-10-CM | POA: Diagnosis not present

## 2024-08-02 DIAGNOSIS — M170 Bilateral primary osteoarthritis of knee: Secondary | ICD-10-CM | POA: Diagnosis not present

## 2024-08-22 NOTE — Progress Notes (Addendum)
 HPI: Alan Nunez is a 68 y.o. male who presents for follow up diabetes.  He was diagnosed around 2013. I last saw him in 6/25.  At that time I increased his Tresiba  and tried to start him on the Dexcom.  He is accompanied by his wife today as she gave most of his history,  He takes metformin 1000 mg twice a day, Tresiba  20 units QAM, and Novolog  sliding scale with meals.  They say mostly 20-24 If under 120, take 4 units (sometimes does not take it when his sugars are good).  If 120-200, Take 8 units If 201-300, take 12 unit  If 301-400, take 16 units  If over 400 take 20 units.  He checks his sugars twice a day most days  He brought his log book and I reviewed it.  He is mostly in the 300 to 500 range. Review of his diet shows that he avoids concentrated carbohydrates.  He does not really do any exercise.  He is concerned about his diabetes.  ROS:  No chest pain.  No shortness of breath.   Medical History: Past Medical History:  Diagnosis Date   Arthritis    Cervicalgia    Diabetes mellitus type 2, uncomplicated (CMS/HHS-HCC)    poorly controlled   Disorder of vestibular function    Erectile dysfunction    Esophageal reflux    Prior H Pylori + and treated - EGD in 2010   Essential hypertension, benign    GERD (gastroesophageal reflux disease)    Low testosterone    has seen Dr Kassie   Mitral valve disorders(424.0)    Pain of cervical spine    chronic, follows with surg in [place] Dr [place]   Type 2 diabetes mellitus with microalbuminuria, with long-term current use of insulin  (CMS/HHS-HCC) 09/10/2014   Vestibular dysfunction     Surgical History: Past Surgical History:  Procedure Laterality Date   CHOLECYSTECTOMY  12/29/2022   robotic assisted lap by Dr .Henriette Pierre   Cervical Rods inserted     HERNIA REPAIR Right    SPINE SURGERY     TONSILLECTOMY      Social History:  reports that he has never smoked. He has never used smokeless tobacco. He  reports that he does not drink alcohol and does not use drugs.    Family History: family history includes Arthritis in his mother; Breast cancer in his sister; Cancer in his sister; Coronary Artery Disease (Blocked arteries around heart) in his brother; Dementia in his father; Diabetes in his sister; ESRD-Dialysis in his mother; Heart disease in his sister; High blood pressure (Hypertension) in his mother; Kidney disease in his mother.  Medications: Current Outpatient Medications  Medication Sig Dispense Refill   acetaminophen  (TYLENOL ) 650 MG ER tablet Take 650 mg by mouth every 8 (eight) hours as needed for Pain     amLODIPine (NORVASC) 10 MG tablet TAKE 1 TABLET BY MOUTH EVERY DAY 90 tablet 3   donepeziL  (ARICEPT ) 5 MG tablet TAKE 1 TABLET AT BEDTIME 90 tablet 3   FUROsemide (LASIX) 20 MG tablet TAKE 1 TABLET BY MOUTH EVERY DAY 90 tablet 1   insulin  ASPART (NOVOLOG  U-100 INSULIN  ASPART) injection (concentration 100 units/mL) UP TO 76 UNITS DAILY IN V-GO DELIVERY SEYSTEM. 70 mL 3   insulin  DEGLUDEC (TRESIBA  FLEXTOUCH U-100) pen injector (concentration 100 units/mL) Inject 24 Units subcutaneously once daily 15 mL 4   insulin  syringe-needle U-100 (BD VEO INSULIN  SYRINGE UF) 0.3 mL 31 gauge x  15/64 Syrg USE ONCE DAILY AS DIRECTED 100 each 1   losartan-hydroCHLOROthiazide (HYZAAR) 100-12.5 mg tablet TAKE 1 TABLET BY MOUTH EVERY DAY 90 tablet 1   metFORMIN (GLUCOPHAGE) 1000 MG tablet Take 1 tablet (1,000 mg total) by mouth 2 (two) times daily with meals 180 tablet 3   pen needle, diabetic (BD NANO 2ND GEN PEN NEEDLE) 32 gauge x 5/32 Ndle Use 1 each once daily 100 each 3   potassium 99 mg Tab Take 1 tablet by mouth once daily     blood-glucose sensor (DEXCOM G7 SENSOR) Devi Use 1 each every 10 (ten) days (Patient not taking: Reported on 08/22/2024) 9 each 4   No current facility-administered medications for this visit.    Allergies: Allergies  Allergen Reactions   Codeine  Sulfate Other (See Comments)    Hallucinations   Prednisone Itching, Rash and Other (See Comments)    Couldn't sleep at night    Morphine Hives and Rash    Physical Exam: Vitals:   08/22/24 1015  BP: 132/80  Pulse: 80  SpO2: 99%  Weight: 70.3 kg (155 lb)  Height: 170.2 cm (5' 7)      Body mass index is 24.28 kg/m. GEN: well developed male in NAD.   Physical exam otherwise deferred due to coronavirus precautions.   Labs: 09/06/2017: A1c = 9.1 03/06/2018: A1c = 9.8 07/18/2018: A1c = 8.8 11/20/2018: A1c = 10.0.  K/Cr/Ca = 5.1/0.9/9.7.  Microalbumin = 272.2.  Cholesterol = 168/54/64/93.  LFTs with elevated alkaline phosphatase = 256 and AST = 47 (8-39). 03/09/2019: D = 37.6 03/20/2019: A1c = 9.0 03/20/2019: TSH=2.3. 08/15/2019: A1c = 9.7  12/13/2019: A1c = 9.7 02/25/2020: K/Cr/Ca = 5.0/1.2/9.9.  LFTs normal except alk phos = 159.  TSH = 1.06.  B12 = 574 06/11/2020: A1c = 10.2.  Cholesterol = 169/90/69.9/80.    11/12/2020: A1c = 10.9. 04/09/2021: A1c = 10.0.   K/Cr/Ca = 4.7/1.2/9.7.  Microalbumin = 350.5.  Cholesterol = 185/74/84.8/85.  LFTs normal except alk phos = 122.  TSH = 1.432.   08/19/2021:  A1c = 10.5.    02/16/2022:  A1c = 10.6 08/24/2022:  A1c = 10.9  11/29/2022: A1c = 12.7  11/30/2022: K/Cr/Ca = 3.7/1.21/8.5 01/11/2023:  A1c = 10.3.  K/Cr/Ca = 4.9/1.2/10.1.  MA = 107.6.  Chol = 201/79/99.6/86.  LFTs nl except for alk phos = 215.  TSH = 0.718.  04/20/2023:  A1c = 8.8 09/06/2023:  A1c = 11.4  01/12/2024:  B12 = 813.  D = 51.6 04/25/2024: A1c = 10.9.  K/Cr/Ca= 4.5/1.6/10.2, eGFR=47.  Chol=227/116/72.4/131.  LFTs nl except alk phos=123 (34-104).  TSH=1.378.  08/22/2024: A1c = 11.9.  Assessment/Plan: 1.  Type 2 diabetes.  His diabetes has chronically been poorly controlled.  His A1c today is 11.9.  His wife would like to start him on the OP5 pump. He deferred the idea, as he would prefer to stick to his current regimen. Based on review of his sugars, I will increase his  tresiba  to 30 units.  I will keep his Novolog  sliding scale the same: If under 120, take 4 units If 120-200, Take 8 units If 201-300, take 12 unit  If 301-400, take 16 units  If over 400 take 20 units. I told him to take the 4 units even if under 120 if he is eating a full meal, otherwise he will go up to 300 after the meal and stay there for several hours.  I will try to restart him on  the Dexcom.  I sent an rx. I encouraged lifestyle modifications.   2.  Hypertension/microalbuminuria associated with diabetes.  His blood pressure is ok today on losartan 100 mg daily and Norvasc 10.  Microalbumin elevated at last check in 3/24.  3.  Neuropathy.  His foot exam in 2/24 showed abnormal filament.  4.  Prophylaxis. I will plan a foot exam next visit.  Lipids have been okay on no therapy, most recently in 3/24.  We last got a copy from the eye doctor on 04/04/23 [facility]) and had no retinopathy.  His wife says he went to the eye doctor recently. I will try to get a copy of the report.   5.  He will return to clinic in 4 months.  08/23/24:  Per [facility], not seen since 04/2023.  08/24/24:  OP5 rep reached out to ask if he was interested.  Told we would discuss again next visit  This note is partially prepared by Earla Daria Messier, Scribe, in the presence of and acting as the scribe of Dr. Debby Breaker , MD.     [facility], MD.

## 2024-09-04 NOTE — Progress Notes (Signed)
 Alan Nunez is a 68 y.o. here for Medicare Wellness Visit and annual physical  MEDICARE WELLNESS VISIT Providers Rendering Care 1. Dr. Lavenia Beaver (PCP) 2.  Endocrinology 3.  Neurology  Functional Assessment  (1) Hearing: Demonstrates no difficulty in hearing during normal conversation (2) Risk of Falls: Patient denies any falls or near falls in the last year, Gait is slow and steady without assistance during walk from waiting area to exam room.  Pausing due to bilateral knee pain. (3) Home Safety: Patient feels secure in their home, There are operational smoke alarms in multiple areas of the home (4) Activities of Daily Living: Independently manages personal grooming and household chores, including cooking, cleaning and laundry. Manages Personal finances without assistance.  PHQ 2/9 from today's flowsheet  PHQ-2 PHQ-2 Over the last 2 weeks, how often have you been bothered by any of the following problems? Little interest or pleasure in doing things: Not at all Feeling down, depressed, or hopeless: Not at all Patient Health Questionnaire-2 Score: 0  PHQ-9 (if PHQ >=3)    Depression Severity and Treatment Recommendations:  0-4= None  5-9= Mild / Treatment: Support, educate to call if worse; return in one month  10-14= Moderate / Treatment: Support, watchful waiting; Antidepressant or Psychotherapy  15-19= Moderately severe / Treatment: Antidepressant OR Psychotherapy  >= 20 = Major depression, severe / Antidepressant AND Psychotherapy   Depression Screening PHQ 2/9 last 3 flowsheet values    04/03/2020    9:49 AM 12/30/2022    3:13 PM 09/04/2024    3:21 PM  PHQ-2/9 Depression Screening   Little interest or pleasure in doing things   0  Feeling down, depressed, or hopeless   0  Patient Health Questionnaire-2 Score   0  (OBSOLETE) Little interest or pleasure in doing things 0 0   (OBSOLETE) Feeling down, depressed, or hopeless (or irritable for Teens only)? 1 0    (OBSOLETE) Total Prescreening Score 1 0   (OBSOLETE) Total Score = 1 0     Depression Severity and Treatment Recommendations:  0-4= None  5-9= Mild / Treatment: Support, educate to call if worse; return in one month  10-14= Moderate / Treatment: Support, watchful waiting; Antidepressant or Psychotherapy  15-19= Moderately severe / Treatment: Antidepressant OR Psychotherapy  >= 20 = Major depression, severe / Antidepressant AND Psychotherapy   Cognitive Impairment Patient denies episodes of loosing things, being forgetful. Seems oriented to person, place and time. Responses appear appropriate and timely to this observer.    * No data to display           PREVENTION PLAN Cardiovascular:  Lab Results  Component Value Date   LDLCALC 131 (H) 04/25/2024   Diabetes:  Lab Results  Component Value Date   HGBA1C 11.9 (H) 08/22/2024   HGBA1C 10.9 (H) 04/25/2024   Glaucoma: UTD  Smoking Cessation:  Not Applicable  Other Personalized Health Advice Encouraged patient to exercise 5 days a week, walking, water  aerobics, gentle stretching recommended. Increase dietary intake of fresh fruits and vegetables, reduce red meat to twice a week.   Goals       Follow my doctor's care plan     * Maintain health/healthy lifestyle (pt-stated)        End of Life Counseling Patient has living will in place; POA - ; Full Code  Current Outpatient Medications  Medication Sig Dispense Refill   acetaminophen  (TYLENOL ) 650 MG ER tablet Take 650 mg by mouth every 8 (eight) hours  as needed for Pain     amLODIPine (NORVASC) 10 MG tablet TAKE 1 TABLET BY MOUTH EVERY DAY 90 tablet 3   blood-glucose,receiver,cont (FREESTYLE LIBRE 3 READER) Misc USE 1 DEVICE AS DIRECTED TO MONITOR BLOOD SUGAR CONTINUOUSLY     donepeziL  (ARICEPT ) 5 MG tablet TAKE 1 TABLET AT BEDTIME 90 tablet 3   FUROsemide (LASIX) 20 MG tablet TAKE 1 TABLET BY MOUTH EVERY DAY 90 tablet 1   insulin  ASPART (NOVOLOG  U-100  INSULIN  ASPART) injection (concentration 100 units/mL) UP TO 76 UNITS DAILY IN V-GO DELIVERY SEYSTEM. 70 mL 3   insulin  DEGLUDEC (TRESIBA  FLEXTOUCH U-100) pen injector (concentration 100 units/mL) Inject 24 Units subcutaneously once daily 15 mL 4   insulin  syringe-needle U-100 (BD VEO INSULIN  SYRINGE UF) 0.3 mL 31 gauge x 15/64 Syrg USE ONCE DAILY AS DIRECTED 100 each 1   losartan-hydroCHLOROthiazide (HYZAAR) 100-12.5 mg tablet TAKE 1 TABLET BY MOUTH EVERY DAY 90 tablet 1   metFORMIN (GLUCOPHAGE) 1000 MG tablet Take 1 tablet (1,000 mg total) by mouth 2 (two) times daily with meals 180 tablet 3   pen needle, diabetic (BD NANO 2ND GEN PEN NEEDLE) 32 gauge x 5/32 Ndle Use 1 each once daily 100 each 3   potassium 99 mg Tab Take 1 tablet by mouth once daily     rosuvastatin (CRESTOR) 10 MG tablet Take 1 tablet (10 mg total) by mouth once daily 90 tablet 1   No current facility-administered medications for this visit.    Allergies as of 09/04/2024 - Reviewed 09/04/2024  Allergen Reaction Noted   Codeine sulfate Other (See Comments) 03/27/2014   Prednisone Itching, Rash, and Other (See Comments) 04/02/2019   Morphine Hives and Rash 03/27/2014    Patient Active Problem List  Diagnosis   Esophageal reflux   Cervicalgia   Mitral valve disorders(424.0)   Hyperlipidemia associated with type 2 diabetes mellitus (CMS/HHS-HCC)   Hypertension associated with type 2 diabetes mellitus (CMS/HHS-HCC)   Microalbuminuria   Right hand pain   Right hand weakness   Neck pain   Depression, prolonged   Bilateral shoulder pain   Primary osteoarthritis of first carpometacarpal joint of right hand   Elevated erythrocyte sedimentation rate   Chronic pain of right ankle   Neck pain on right side   Tobacco use   DKA (diabetic ketoacidosis) (CMS/HHS-HCC)    Past Medical History:  Diagnosis Date   Arthritis    Cervicalgia    Diabetes mellitus type 2, uncomplicated  (CMS/HHS-HCC)    poorly controlled   Disorder of vestibular function    Erectile dysfunction    Esophageal reflux    Prior H Pylori + and treated - EGD in 2010   Essential hypertension, benign    GERD (gastroesophageal reflux disease)    Low testosterone    has seen Dr Kassie   Mitral valve disorders(424.0)    Pain of cervical spine    chronic, follows with surg in [place] Dr [facility]   Type 2 diabetes mellitus with microalbuminuria, with long-term current use of insulin  (CMS/HHS-HCC) 09/10/2014   Vestibular dysfunction     Past Surgical History:  Procedure Laterality Date   CHOLECYSTECTOMY  12/29/2022   robotic assisted lap by Dr .Henriette Pierre   Cervical Rods inserted     HERNIA REPAIR Right    SPINE SURGERY     TONSILLECTOMY      Health Maintenance  Topic Date Due   Colorectal Cancer Screening  Never done   RSV Immunization Pregnant  or 50+ (1 - Risk 50-74 years 1-dose series) Never done   Adult Tetanus (Td And Tdap)  05/26/2009   Annual Urine Albumin Creatinine Ratio  01/11/2024   Diabetes Eye Assessment Exam  04/03/2024   COVID-19 Vaccine (1 - 2025-26 season) 07/02/2024   Hemoglobin A1C  11/22/2024   PSA  01/10/2025   Creatinine Level  04/25/2025   Potassium Level  04/25/2025   Lipid Panel  04/25/2025   Serum Calcium   04/25/2025   Monofilament Foot Exam  09/04/2025   Diabetes Education  09/04/2025   Depression Screening  09/04/2025   Medicare Subsequent AWV [id number]  09/05/2025   Annual Physical/Well Child Check  09/05/2025   Medicare Initial AWV [id number]  Completed   Pneumococcal Vaccine: 50+  Completed   Shingrix  Completed   Hepatitis C Screen  Completed   Influenza Vaccine  Completed   Hib Vaccines  Aged Out   Hepatitis A Vaccines  Aged Out   Meningococcal B Vaccine  Aged Out   Meningococcal ACWY Vaccine  Aged Out   HPV Vaccines  Aged Out    Social History:  reports that he has never smoked. He has never used smokeless  tobacco. He reports that he does not drink alcohol and does not use drugs.  Vitals:   09/04/24 1506  BP: 110/68  Pulse: 74  SpO2: 99%  Weight: 68.6 kg (151 lb 3.2 oz)  Height: 170.2 cm (5' 7)  PainSc: 0-No pain   Body mass index is 23.68 kg/m.   Goals       Follow my doctor's care plan     * Maintain health/healthy lifestyle (pt-stated)         Subjective:  History of Present Illness Alan Nunez is a 68 year old male with diabetes who presents for annual wellness visit and physical.  He is here with his spouse.  Following with endocrinology and also neurology for his uncontrolled diabetes and early onset Alzheimer's disease.  Trouble with memory and new memory retention.  Currently on Aricept  at bedtime. Since starting the freestyle libre, he is able to monitor his sugars continuously.  Last A1c is still pretty significantly elevated.  He is on the Tresiba  and NovoLog  and metformin.   He has not experienced any falls at home and reports no numbness or pain in his feet. He does have bilateral knee pain and arthritis which slows him down.  He experienced a brief stomach virus on Sunday, which resolved within 24 hours. His memory has been declining, and his caregiver has started him on Ginkgo Biloba to help with cognitive function.  No recent upper respiratory infections, and his vision is okay. He has been eating well and has regular bowel movements without nausea, vomiting, or diarrhea.    ROS:  General: No fever, chills or recent illness. No change in weight Skin:   No skin lesions, growths, masses, rashes, pruritus  HEENT: No change in vision or hearing. No pain or difficulty with swallowing Respiratory: No cough or shortness of breath CV:  No chest pain or palpitations GI:  No pain, dyspepsia or change in bowel habits GU:  No dysuria, frequency, or hesitancy MSK:  No joint pain or injury, bilateral knee pain Neurological: No headaches, changes in mental status,  loss of sensation or strength, declining memory Endocrine:  No heat or cold intolerance, polydipsia, polyuria Psychological: No anxiety insomnia or depression  Objective:   Body mass index is 23.68 kg/m.  BP 110/68  Pulse 74   Ht 170.2 cm (5' 7)   Wt 68.6 kg (151 lb 3.2 oz)   SpO2 99%   BMI 23.68 kg/m   General: WD/WN male, in no acute distress HEENT: Pupils equal and round, EOMI.  OP moist without lesions Neck: supple, trachea midline; no thyromegaly Chest: normal to palpation Lungs:clear to auscultation without wheeze or retraction Cardiac:  Regular rate and rhythm without murmur, gallops, or rubs Vascular: No carotid bruit and radials 2+; distal pulses 2+ Abdominal:soft, nontender, positive bowel sounds.  No guarding or rebound tenderness Extremities:  No clubbing, cyanosis or edema Neuro: CN grossly intact.  Gait intact.  No acute motor or sensory deficits Dermatologic: no significant rashes or nodules Lymph: no cervical or supraclavicular lymphadenopathy   Assessment/Plan:   Medicare annual wellness visit, subsequent  (primary encounter diagnosis) Type 2 diabetes mellitus with microalbuminuria, with long-term current use of insulin  (CMS/HHS-HCC) Essential hypertension Hyperlipidemia associated with type 2 diabetes mellitus (CMS/HHS-HCC) Arthritis of both knees Depression screening (Z13.31) Type 2 diabetes mellitus without complication, with long-term current use of insulin  (CMS/HHS-HCC) Annual physical exam Screening for prostate cancer  Assessment and Plan  Assessment & Plan  1. Medicare wellness visit and annual wellness-  -Reviewed records, discussed history -Medications and allergies reviewed.  - Copy of preventative health provided.  -New labs ordered -Discussed living situation, diet and activity -Briefly addressed health maintenance   2.  Uncontrolled Type 2 diabetes- -Following with endocrinology . -Currently on Tresiba  dose increased, metformin  and NovoLog . -Follows with [facility] for his eye exams. - Most recent A1c still at 11.9 - Does have a continuous glucose meter on and trying to adjust his diet - Diabetes education provided. - Monofilament exam with very slightly decreased sensation to light touch in both feet.   3.  Hypertension- -Low-salt diet - Blood pressure is well-controlled - Continue amlodipine, Lasix, losartan.   4.  Early onset Alzheimer's-- -Following with neurology.  On Aricept .   5. Bilateral primary osteoarthritis of knee - Chronic knee pain due to osteoarthritis, primarily affecting the right knee. Pain is intermittent and exacerbated by walking. - Continue to monitor closely.  No significant swelling at this time.  Diabetes needs to be better controlled prior to any injections or surgeries  6.  Hyperlipidemia- - Started on Crestor - Low-fat low-cholesterol diet recommended    Follow-up with me in 6 months for follow-up of diabetes, hypertension dementia and arthritis       RADHIKA KALISETTI, MD   *Some images could not be shown.

## 2024-10-15 ENCOUNTER — Other Ambulatory Visit: Payer: Self-pay

## 2024-10-15 ENCOUNTER — Inpatient Hospital Stay
Admission: EM | Admit: 2024-10-15 | Discharge: 2024-10-18 | Disposition: A | Attending: Internal Medicine | Admitting: Internal Medicine

## 2024-10-15 DIAGNOSIS — E1111 Type 2 diabetes mellitus with ketoacidosis with coma: Secondary | ICD-10-CM | POA: Diagnosis not present

## 2024-10-15 DIAGNOSIS — G300 Alzheimer's disease with early onset: Secondary | ICD-10-CM | POA: Diagnosis present

## 2024-10-15 DIAGNOSIS — E875 Hyperkalemia: Secondary | ICD-10-CM | POA: Diagnosis present

## 2024-10-15 DIAGNOSIS — Z515 Encounter for palliative care: Secondary | ICD-10-CM

## 2024-10-15 DIAGNOSIS — Z66 Do not resuscitate: Secondary | ICD-10-CM | POA: Diagnosis present

## 2024-10-15 DIAGNOSIS — Z885 Allergy status to narcotic agent status: Secondary | ICD-10-CM

## 2024-10-15 DIAGNOSIS — Z888 Allergy status to other drugs, medicaments and biological substances status: Secondary | ICD-10-CM | POA: Diagnosis not present

## 2024-10-15 DIAGNOSIS — Z79899 Other long term (current) drug therapy: Secondary | ICD-10-CM

## 2024-10-15 DIAGNOSIS — Z794 Long term (current) use of insulin: Secondary | ICD-10-CM | POA: Diagnosis not present

## 2024-10-15 DIAGNOSIS — A419 Sepsis, unspecified organism: Secondary | ICD-10-CM | POA: Diagnosis present

## 2024-10-15 DIAGNOSIS — K219 Gastro-esophageal reflux disease without esophagitis: Secondary | ICD-10-CM | POA: Diagnosis present

## 2024-10-15 DIAGNOSIS — N179 Acute kidney failure, unspecified: Secondary | ICD-10-CM | POA: Diagnosis present

## 2024-10-15 DIAGNOSIS — E1110 Type 2 diabetes mellitus with ketoacidosis without coma: Secondary | ICD-10-CM | POA: Diagnosis present

## 2024-10-15 DIAGNOSIS — G4733 Obstructive sleep apnea (adult) (pediatric): Secondary | ICD-10-CM | POA: Diagnosis present

## 2024-10-15 DIAGNOSIS — G9341 Metabolic encephalopathy: Secondary | ICD-10-CM | POA: Diagnosis present

## 2024-10-15 DIAGNOSIS — E871 Hypo-osmolality and hyponatremia: Secondary | ICD-10-CM | POA: Diagnosis present

## 2024-10-15 DIAGNOSIS — I10 Essential (primary) hypertension: Secondary | ICD-10-CM | POA: Diagnosis present

## 2024-10-15 DIAGNOSIS — R6521 Severe sepsis with septic shock: Secondary | ICD-10-CM | POA: Diagnosis present

## 2024-10-15 DIAGNOSIS — F0390 Unspecified dementia without behavioral disturbance: Secondary | ICD-10-CM | POA: Diagnosis not present

## 2024-10-15 DIAGNOSIS — Z7984 Long term (current) use of oral hypoglycemic drugs: Secondary | ICD-10-CM

## 2024-10-15 DIAGNOSIS — I959 Hypotension, unspecified: Secondary | ICD-10-CM

## 2024-10-15 DIAGNOSIS — E1010 Type 1 diabetes mellitus with ketoacidosis without coma: Principal | ICD-10-CM

## 2024-10-15 DIAGNOSIS — F0280 Dementia in other diseases classified elsewhere without behavioral disturbance: Secondary | ICD-10-CM | POA: Diagnosis present

## 2024-10-15 DIAGNOSIS — G9340 Encephalopathy, unspecified: Secondary | ICD-10-CM | POA: Diagnosis not present

## 2024-10-15 DIAGNOSIS — R7989 Other specified abnormal findings of blood chemistry: Secondary | ICD-10-CM | POA: Diagnosis not present

## 2024-10-15 DIAGNOSIS — F02B0 Dementia in other diseases classified elsewhere, moderate, without behavioral disturbance, psychotic disturbance, mood disturbance, and anxiety: Secondary | ICD-10-CM | POA: Diagnosis not present

## 2024-10-15 LAB — COMPREHENSIVE METABOLIC PANEL WITH GFR
ALT: 66 U/L — ABNORMAL HIGH (ref 0–44)
AST: 43 U/L — ABNORMAL HIGH (ref 15–41)
Albumin: 4.2 g/dL (ref 3.5–5.0)
Alkaline Phosphatase: 128 U/L — ABNORMAL HIGH (ref 38–126)
BUN: 56 mg/dL — ABNORMAL HIGH (ref 8–23)
CO2: <7 mmol/L — ABNORMAL LOW (ref 22–32)
Calcium: 9.7 mg/dL (ref 8.9–10.3)
Chloride: 78 mmol/L — ABNORMAL LOW (ref 98–111)
Creatinine, Ser: 2.59 mg/dL — ABNORMAL HIGH (ref 0.61–1.24)
GFR, Estimated: 26 mL/min — ABNORMAL LOW (ref >60)
Glucose, Bld: 1021 mg/dL (ref 70–99)
Potassium: >7.5 mmol/L (ref 3.5–5.1)
Sodium: 121 mmol/L — ABNORMAL LOW (ref 135–145)
Total Bilirubin: 0.3 mg/dL (ref 0.0–1.2)
Total Protein: 6.2 g/dL — ABNORMAL LOW (ref 6.5–8.1)

## 2024-10-15 LAB — CBC
HCT: 35.7 % — ABNORMAL LOW (ref 39.0–52.0)
Hemoglobin: 10.3 g/dL — ABNORMAL LOW (ref 13.0–17.0)
MCH: 31 pg (ref 26.0–34.0)
MCHC: 28.9 g/dL — ABNORMAL LOW (ref 30.0–36.0)
MCV: 107.5 fL — ABNORMAL HIGH (ref 80.0–100.0)
Platelets: 254 K/uL (ref 150–400)
RBC: 3.32 MIL/uL — ABNORMAL LOW (ref 4.22–5.81)
RDW: 13.6 % (ref 11.5–15.5)
WBC: 22.5 K/uL — ABNORMAL HIGH (ref 4.0–10.5)
nRBC: 0 % (ref 0.0–0.2)

## 2024-10-15 LAB — BLOOD GAS, VENOUS
Acid-base deficit: 23.9 mmol/L — ABNORMAL HIGH (ref 0.0–2.0)
Bicarbonate: 4.5 mmol/L — ABNORMAL LOW (ref 20.0–28.0)
O2 Saturation: 77.1 %
Patient temperature: 37
pCO2, Ven: <18 mmHg — CL (ref 44–60)
pH, Ven: 7.06 — CL (ref 7.25–7.43)
pO2, Ven: 49 mmHg — ABNORMAL HIGH (ref 32–45)

## 2024-10-15 LAB — CBG MONITORING, ED
Glucose-Capillary: >600 mg/dL (ref 70–99)
Glucose-Capillary: >600 mg/dL (ref 70–99)
Glucose-Capillary: >600 mg/dL (ref 70–99)

## 2024-10-15 LAB — TROPONIN T, HIGH SENSITIVITY: Troponin T High Sensitivity: 43 ng/L — ABNORMAL HIGH (ref 0–19)

## 2024-10-15 LAB — BETA-HYDROXYBUTYRIC ACID: Beta-Hydroxybutyric Acid: >8 mmol/L — ABNORMAL HIGH (ref 0.05–0.27)

## 2024-10-15 LAB — ETHANOL: Alcohol, Ethyl (B): <15 mg/dL (ref <15)

## 2024-10-15 LAB — LACTIC ACID, PLASMA: Lactic Acid, Venous: 6.5 mmol/L (ref 0.5–1.9)

## 2024-10-15 LAB — LIPASE, BLOOD: Lipase: 42 U/L (ref 11–51)

## 2024-10-15 MED ORDER — DEXTROSE IN LACTATED RINGERS 5 % IV SOLN: INTRAVENOUS | Status: DC

## 2024-10-15 MED ORDER — CHLORHEXIDINE GLUCONATE CLOTH 2 % EX PADS
6.0000 | MEDICATED_PAD | Freq: Every day | CUTANEOUS | Status: DC
  Filled 2024-10-15: qty 6

## 2024-10-15 MED ORDER — DEXTROSE 50 % IV SOLN: 0.0000 mL | INTRAVENOUS | Status: DC | PRN

## 2024-10-15 MED ORDER — POLYETHYLENE GLYCOL 3350 17 G PO PACK: 17.0000 g | PACK | Freq: Every day | ORAL | Status: DC | PRN

## 2024-10-15 MED ORDER — LACTATED RINGERS IV BOLUS
1000.0000 mL | Freq: Once | INTRAVENOUS | Status: AC
  Administered 2024-10-15: 22:00:00 1000 mL via INTRAVENOUS

## 2024-10-15 MED ORDER — INSULIN REGULAR(HUMAN) IN NACL 100-0.9 UT/100ML-% IV SOLN
INTRAVENOUS | Status: DC
  Administered 2024-10-15: 6.5 [IU]/h via INTRAVENOUS
  Filled 2024-10-15 (×2): qty 100

## 2024-10-15 MED ORDER — LACTATED RINGERS IV BOLUS
20.0000 mL/kg | Freq: Once | INTRAVENOUS | Status: AC
  Administered 2024-10-15: 1476 mL via INTRAVENOUS

## 2024-10-15 MED ORDER — CALCIUM GLUCONATE 10 % IV SOLN
1.0000 g | Freq: Once | INTRAVENOUS | Status: AC
  Administered 2024-10-15: 23:00:00 1 g via INTRAVENOUS
  Filled 2024-10-15: qty 10

## 2024-10-15 MED ORDER — LACTATED RINGERS IV SOLN: INTRAVENOUS | Status: DC

## 2024-10-15 MED ORDER — SODIUM CHLORIDE 0.9 % IV BOLUS
1000.0000 mL | Freq: Once | INTRAVENOUS | Status: AC
  Administered 2024-10-16: 01:00:00 1000 mL via INTRAVENOUS

## 2024-10-15 MED ORDER — DOCUSATE SODIUM 100 MG PO CAPS: 100.0000 mg | ORAL_CAPSULE | Freq: Two times a day (BID) | ORAL | Status: DC | PRN

## 2024-10-15 MED ORDER — HEPARIN SODIUM (PORCINE) 5000 UNIT/ML IJ SOLN
5000.0000 [IU] | Freq: Three times a day (TID) | INTRAMUSCULAR | Status: DC
  Administered 2024-10-16 – 2024-10-18 (×7): 5000 [IU] via SUBCUTANEOUS
  Filled 2024-10-15 (×7): qty 1

## 2024-10-15 NOTE — ED Provider Notes (Signed)
 [facility] Provider Note    Event Date/Time   First MD Initiated Contact with Patient 10/15/24 2142     (approximate)   History   No chief complaint on file.   HPI  Alan Nunez is a 68 y.o. male with a past medical history of type 1 diabetes, hypertension, dementia, presenting to the emergency department via EMS for shortness of breath and altered mental status.  EMS reports that the patient was initially hypotensive with a systolic pressure in the 70s.  They reported this did not improve after fluids.  They note that the patient was only oriented to person for them.  The patient is unable to provide history at this time.   Physical Exam   Triage Vital Signs: ED Triage Vitals  Encounter Vitals Group     BP      Girls Systolic BP Percentile      Girls Diastolic BP Percentile      Boys Systolic BP Percentile      Boys Diastolic BP Percentile      Pulse      Resp      Temp      Temp src      SpO2      Weight      Height      Head Circumference      Peak Flow      Pain Score      Pain Loc      Pain Education      Exclude from Growth Chart     Most recent vital signs: There were no vitals filed for this visit.   General: Awake, oriented to person but not to place or time CV:  Good peripheral perfusion.  Resp:  Normal effort.  Abd:  No distention.  Other:  Alert, oriented to person but not to place or time   ED Results / Procedures / Treatments   Labs (all labs ordered are listed, but only abnormal results are displayed) Labs Reviewed  CBG MONITORING, ED - Abnormal; Notable for the following components:      Result Value   Glucose-Capillary >600 (*)    All other components within normal limits  CULTURE, BLOOD (ROUTINE X 2)  CULTURE, BLOOD (ROUTINE X 2)  COMPREHENSIVE METABOLIC PANEL WITH GFR  ETHANOL  LIPASE, BLOOD  CBC  URINALYSIS, ROUTINE W REFLEX MICROSCOPIC  BLOOD GAS, VENOUS  BETA-HYDROXYBUTYRIC ACID  TROPONIN T,  HIGH SENSITIVITY     EKG  ED ECG REPORT I, Reche CHRISTELLA Leventhal, the attending physician, personally viewed and interpreted this ECG.  Date: 10/15/2024 Rate: 90 Rhythm: Junctional rhythm QRS Axis: Left axis deviation Intervals: normal ST/T Wave abnormalities: Right bundle branch block Narrative Interpretation: no evidence of acute ischemia    RADIOLOGY     PROCEDURES:  Critical Care performed:  CRITICAL CARE Performed by: Reche CHRISTELLA Leventhal   Total critical care time: 30 minutes  Critical care time was exclusive of separately billable procedures and treating other patients.  Critical care was necessary to treat or prevent imminent or life-threatening deterioration.  Critical care was time spent personally by me on the following activities: development of treatment plan with patient and/or surrogate as well as nursing, discussions with consultants, evaluation of patient's response to treatment, examination of patient, obtaining history from patient or surrogate, ordering and performing treatments and interventions, ordering and review of laboratory studies, ordering and review of radiographic studies, pulse oximetry and re-evaluation of patient's condition.  Procedures   MEDICATIONS ORDERED IN ED: Medications  lactated ringers  bolus 20 mL/kg (has no administration in time range)  insulin  regular, human (MYXREDLIN ) 100 units/ 100 mL infusion (has no administration in time range)  lactated ringers  infusion ( Intravenous New Bag/Given 10/15/24 2303)  dextrose  5 % in lactated ringers  infusion (0 mLs Intravenous Hold 10/15/24 2257)  dextrose  50 % solution 0-50 mL (has no administration in time range)  lactated ringers  bolus 1,000 mL (0 mLs Intravenous Stopped 10/15/24 2238)  calcium  gluconate inj 10% (1 g) URGENT USE ONLY! (1 g Intravenous Given 10/15/24 2301)     IMPRESSION / MDM / ASSESSMENT AND PLAN / ED COURSE  I reviewed the triage vital signs and the nursing  notes.                              Differential diagnosis includes, but is not limited to, DKA, hypercapnia, UTI, sepsis, pneumonia, metabolic encephalopathy, electrolyte abnormalities  Patient's presentation is most consistent with acute presentation with potential threat to life or bodily function.  Patient is a 68 year old male with past medical history of type 1 diabetes presenting to the emergency department via EMS for shortness of breath and altered mental status.  EKG and lab work ordered for further evaluation.  IV fluids ordered.  On workup the patient has a pH of 7.06, pCO2 of 16, bicarb of 4.5.  White blood cell count is elevated.  Potassium is elevated at greater than 7.5.  Calcium  gluconate ordered.  Lactic acid also significantly elevated at 6.5.  Glucose is 1021.  Insulin  drip ordered as well as further IV fluids.  Patient and wife are updated on results.  Discussed plan for admission to the ICU and they are agreeable.  I discussed the patient's case with the ICU provider who is in agreement with plan for admission at this time.      FINAL CLINICAL IMPRESSION(S) / ED DIAGNOSES   Final diagnoses:  Type 1 diabetes mellitus with ketoacidosis without coma (HCC)  Hypotension, unspecified hypotension type  Hyperkalemia     Rx / DC Orders   ED Discharge Orders     None        Note:  This document was prepared using Dragon voice recognition software and may include unintentional dictation errors.   Rexford Reche HERO, MD 10/15/24 2308

## 2024-10-15 NOTE — ED Notes (Signed)
 CC NP at bedside. Verbal order to stop LR fluids. Pt had received approx 2 L of LR at this time of DC

## 2024-10-15 NOTE — ED Triage Notes (Signed)
 Pt BIB [facility] following a call for SOB. Tachypnea on arrival. Found to be AMS with EMS and CBG >600. Arrives 2 L [place]. Not on oxygen at baseline. Initial SBP 74 given 400 mL IVF. 18G RAC. Abnormal EKG

## 2024-10-16 ENCOUNTER — Inpatient Hospital Stay

## 2024-10-16 DIAGNOSIS — A419 Sepsis, unspecified organism: Principal | ICD-10-CM

## 2024-10-16 DIAGNOSIS — G9340 Encephalopathy, unspecified: Secondary | ICD-10-CM

## 2024-10-16 DIAGNOSIS — N179 Acute kidney failure, unspecified: Secondary | ICD-10-CM

## 2024-10-16 DIAGNOSIS — E1111 Type 2 diabetes mellitus with ketoacidosis with coma: Secondary | ICD-10-CM

## 2024-10-16 DIAGNOSIS — Z7189 Other specified counseling: Secondary | ICD-10-CM

## 2024-10-16 DIAGNOSIS — E871 Hypo-osmolality and hyponatremia: Secondary | ICD-10-CM

## 2024-10-16 LAB — STREP PNEUMONIAE URINARY ANTIGEN: Strep Pneumo Urinary Antigen: NEGATIVE

## 2024-10-16 LAB — BETA-HYDROXYBUTYRIC ACID
Beta-Hydroxybutyric Acid: >8 mmol/L — ABNORMAL HIGH (ref 0.05–0.27)
Beta-Hydroxybutyric Acid: >8 mmol/L — ABNORMAL HIGH (ref 0.05–0.27)
Beta-Hydroxybutyric Acid: 7.42 mmol/L — ABNORMAL HIGH (ref 0.05–0.27)
Beta-Hydroxybutyric Acid: 3.01 mmol/L — ABNORMAL HIGH (ref 0.05–0.27)
Beta-Hydroxybutyric Acid: 0.72 mmol/L — ABNORMAL HIGH (ref 0.05–0.27)
Beta-Hydroxybutyric Acid: 0.21 mmol/L (ref 0.05–0.27)

## 2024-10-16 LAB — TSH: TSH: 1.09 u[IU]/mL (ref 0.350–4.500)

## 2024-10-16 LAB — URINALYSIS, ROUTINE W REFLEX MICROSCOPIC
Bilirubin Urine: NEGATIVE
Glucose, UA: >=500 mg/dL — AB
Hgb urine dipstick: NEGATIVE
Ketones, ur: 20 mg/dL — AB
Leukocytes,Ua: NEGATIVE
Nitrite: NEGATIVE
Protein, ur: NEGATIVE mg/dL
Specific Gravity, Urine: 1.015 (ref 1.005–1.030)
Squamous Epithelial / HPF: 0 /HPF (ref 0–5)
pH: 5 (ref 5.0–8.0)

## 2024-10-16 LAB — GLUCOSE, CAPILLARY
Glucose-Capillary: 124 mg/dL — ABNORMAL HIGH (ref 70–99)
Glucose-Capillary: 150 mg/dL — ABNORMAL HIGH (ref 70–99)
Glucose-Capillary: 156 mg/dL — ABNORMAL HIGH (ref 70–99)
Glucose-Capillary: 157 mg/dL — ABNORMAL HIGH (ref 70–99)
Glucose-Capillary: 160 mg/dL — ABNORMAL HIGH (ref 70–99)
Glucose-Capillary: 161 mg/dL — ABNORMAL HIGH (ref 70–99)
Glucose-Capillary: 163 mg/dL — ABNORMAL HIGH (ref 70–99)
Glucose-Capillary: 183 mg/dL — ABNORMAL HIGH (ref 70–99)
Glucose-Capillary: 215 mg/dL — ABNORMAL HIGH (ref 70–99)
Glucose-Capillary: 256 mg/dL — ABNORMAL HIGH (ref 70–99)
Glucose-Capillary: 291 mg/dL — ABNORMAL HIGH (ref 70–99)
Glucose-Capillary: 332 mg/dL — ABNORMAL HIGH (ref 70–99)
Glucose-Capillary: 374 mg/dL — ABNORMAL HIGH (ref 70–99)
Glucose-Capillary: 482 mg/dL — ABNORMAL HIGH (ref 70–99)
Glucose-Capillary: 526 mg/dL (ref 70–99)
Glucose-Capillary: 542 mg/dL (ref 70–99)
Glucose-Capillary: 574 mg/dL (ref 70–99)
Glucose-Capillary: >600 mg/dL (ref 70–99)

## 2024-10-16 LAB — CORTISOL-AM, BLOOD: Cortisol - AM: 44.5 ug/dL — ABNORMAL HIGH (ref 6.7–22.6)

## 2024-10-16 LAB — COMPREHENSIVE METABOLIC PANEL WITH GFR
ALT: 58 U/L — ABNORMAL HIGH (ref 0–44)
AST: 41 U/L (ref 15–41)
Albumin: 3.5 g/dL (ref 3.5–5.0)
Alkaline Phosphatase: 109 U/L (ref 38–126)
BUN: 51 mg/dL — ABNORMAL HIGH (ref 8–23)
CO2: <7 mmol/L — ABNORMAL LOW (ref 22–32)
Calcium: 9.3 mg/dL (ref 8.9–10.3)
Chloride: 86 mmol/L — ABNORMAL LOW (ref 98–111)
Creatinine, Ser: 2.45 mg/dL — ABNORMAL HIGH (ref 0.61–1.24)
GFR, Estimated: 28 mL/min — ABNORMAL LOW (ref >60)
Glucose, Bld: 776 mg/dL (ref 70–99)
Potassium: 5.4 mmol/L — ABNORMAL HIGH (ref 3.5–5.1)
Sodium: 127 mmol/L — ABNORMAL LOW (ref 135–145)
Total Bilirubin: <0.2 mg/dL (ref 0.0–1.2)
Total Protein: 5.3 g/dL — ABNORMAL LOW (ref 6.5–8.1)

## 2024-10-16 LAB — BASIC METABOLIC PANEL WITH GFR
Anion gap: 29 — ABNORMAL HIGH (ref 5–15)
Anion gap: 19 — ABNORMAL HIGH (ref 5–15)
Anion gap: 12 (ref 5–15)
Anion gap: 12 (ref 5–15)
BUN: 51 mg/dL — ABNORMAL HIGH (ref 8–23)
BUN: 48 mg/dL — ABNORMAL HIGH (ref 8–23)
BUN: 48 mg/dL — ABNORMAL HIGH (ref 8–23)
BUN: 46 mg/dL — ABNORMAL HIGH (ref 8–23)
BUN: 44 mg/dL — ABNORMAL HIGH (ref 8–23)
CO2: <7 mmol/L — ABNORMAL LOW (ref 22–32)
CO2: 12 mmol/L — ABNORMAL LOW (ref 22–32)
CO2: 22 mmol/L (ref 22–32)
CO2: 28 mmol/L (ref 22–32)
CO2: 29 mmol/L (ref 22–32)
Calcium: 9.4 mg/dL (ref 8.9–10.3)
Calcium: 9.6 mg/dL (ref 8.9–10.3)
Calcium: 10.1 mg/dL (ref 8.9–10.3)
Calcium: 10.3 mg/dL (ref 8.9–10.3)
Calcium: 9.4 mg/dL (ref 8.9–10.3)
Chloride: 86 mmol/L — ABNORMAL LOW (ref 98–111)
Chloride: 91 mmol/L — ABNORMAL LOW (ref 98–111)
Chloride: 96 mmol/L — ABNORMAL LOW (ref 98–111)
Chloride: 98 mmol/L (ref 98–111)
Chloride: 98 mmol/L (ref 98–111)
Creatinine, Ser: 2.43 mg/dL — ABNORMAL HIGH (ref 0.61–1.24)
Creatinine, Ser: 2.29 mg/dL — ABNORMAL HIGH (ref 0.61–1.24)
Creatinine, Ser: 2.13 mg/dL — ABNORMAL HIGH (ref 0.61–1.24)
Creatinine, Ser: 1.95 mg/dL — ABNORMAL HIGH (ref 0.61–1.24)
Creatinine, Ser: 1.85 mg/dL — ABNORMAL HIGH (ref 0.61–1.24)
GFR, Estimated: 28 mL/min — ABNORMAL LOW (ref >60)
GFR, Estimated: 31 mL/min — ABNORMAL LOW (ref >60)
GFR, Estimated: 33 mL/min — ABNORMAL LOW (ref >60)
GFR, Estimated: 37 mL/min — ABNORMAL LOW (ref >60)
GFR, Estimated: 39 mL/min — ABNORMAL LOW (ref >60)
Glucose, Bld: 896 mg/dL (ref 70–99)
Glucose, Bld: 582 mg/dL (ref 70–99)
Glucose, Bld: 336 mg/dL — ABNORMAL HIGH (ref 70–99)
Glucose, Bld: 193 mg/dL — ABNORMAL HIGH (ref 70–99)
Glucose, Bld: 171 mg/dL — ABNORMAL HIGH (ref 70–99)
Potassium: 6.2 mmol/L — ABNORMAL HIGH (ref 3.5–5.1)
Potassium: 4.5 mmol/L (ref 3.5–5.1)
Potassium: 3.9 mmol/L (ref 3.5–5.1)
Potassium: 3.8 mmol/L (ref 3.5–5.1)
Potassium: 3.7 mmol/L (ref 3.5–5.1)
Sodium: 126 mmol/L — ABNORMAL LOW (ref 135–145)
Sodium: 132 mmol/L — ABNORMAL LOW (ref 135–145)
Sodium: 136 mmol/L (ref 135–145)
Sodium: 138 mmol/L (ref 135–145)
Sodium: 139 mmol/L (ref 135–145)

## 2024-10-16 LAB — LACTIC ACID, PLASMA
Lactic Acid, Venous: 7.1 mmol/L (ref 0.5–1.9)
Lactic Acid, Venous: 2.9 mmol/L (ref 0.5–1.9)
Lactic Acid, Venous: 2.9 mmol/L (ref 0.5–1.9)
Lactic Acid, Venous: 2.1 mmol/L (ref 0.5–1.9)

## 2024-10-16 LAB — BLOOD GAS, ARTERIAL
Acid-base deficit: 21.9 mmol/L — ABNORMAL HIGH (ref 0.0–2.0)
Bicarbonate: 4.3 mmol/L — ABNORMAL LOW (ref 20.0–28.0)
O2 Saturation: 99.8 %
Patient temperature: 37
pCO2 arterial: <18 mmHg — CL (ref 32–48)
pH, Arterial: 7.16 — CL (ref 7.35–7.45)
pO2, Arterial: 108 mmHg (ref 83–108)

## 2024-10-16 LAB — AMMONIA: Ammonia: 18 umol/L (ref 9–35)

## 2024-10-16 LAB — URINE DRUG SCREEN
Amphetamines: NEGATIVE
Barbiturates: NEGATIVE
Benzodiazepines: NEGATIVE
Cocaine: NEGATIVE
Fentanyl: NEGATIVE
Methadone Scn, Ur: NEGATIVE
Opiates: NEGATIVE
Tetrahydrocannabinol: NEGATIVE

## 2024-10-16 LAB — TROPONIN T, HIGH SENSITIVITY: Troponin T High Sensitivity: 52 ng/L — ABNORMAL HIGH (ref 0–19)

## 2024-10-16 LAB — CBC
HCT: 31.5 % — ABNORMAL LOW (ref 39.0–52.0)
Hemoglobin: 9.7 g/dL — ABNORMAL LOW (ref 13.0–17.0)
MCH: 31.6 pg (ref 26.0–34.0)
MCHC: 30.8 g/dL (ref 30.0–36.0)
MCV: 102.6 fL — ABNORMAL HIGH (ref 80.0–100.0)
Platelets: 210 K/uL (ref 150–400)
RBC: 3.07 MIL/uL — ABNORMAL LOW (ref 4.22–5.81)
RDW: 13.2 % (ref 11.5–15.5)
WBC: 29 K/uL — ABNORMAL HIGH (ref 4.0–10.5)
nRBC: 0 % (ref 0.0–0.2)

## 2024-10-16 LAB — CBG MONITORING, ED
Glucose-Capillary: 592 mg/dL (ref 70–99)
Glucose-Capillary: >600 mg/dL (ref 70–99)
Glucose-Capillary: >600 mg/dL (ref 70–99)
Glucose-Capillary: >600 mg/dL (ref 70–99)
Glucose-Capillary: >600 mg/dL (ref 70–99)
Glucose-Capillary: >600 mg/dL (ref 70–99)
Glucose-Capillary: >600 mg/dL (ref 70–99)

## 2024-10-16 LAB — MRSA NEXT GEN BY PCR, NASAL: MRSA by PCR Next Gen: NOT DETECTED

## 2024-10-16 LAB — HIV ANTIBODY (ROUTINE TESTING W REFLEX): HIV Screen 4th Generation wRfx: NONREACTIVE

## 2024-10-16 LAB — T4, FREE: Free T4: 0.89 ng/dL (ref 0.80–2.00)

## 2024-10-16 LAB — PROCALCITONIN: Procalcitonin: 1.32 ng/mL

## 2024-10-16 LAB — MAGNESIUM: Magnesium: 2.7 mg/dL — ABNORMAL HIGH (ref 1.7–2.4)

## 2024-10-16 MED ORDER — SODIUM CHLORIDE 0.9 % IV SOLN
3.0000 g | Freq: Four times a day (QID) | INTRAVENOUS | Status: DC
  Administered 2024-10-16 – 2024-10-18 (×8): 3 g via INTRAVENOUS
  Filled 2024-10-16 (×11): qty 8

## 2024-10-16 MED ORDER — SODIUM CHLORIDE 0.9 % IV SOLN: Freq: Once | INTRAVENOUS | Status: AC

## 2024-10-16 MED ORDER — STERILE WATER FOR INJECTION IV SOLN
INTRAVENOUS | Status: DC
  Filled 2024-10-16: qty 150
  Filled 2024-10-16: qty 1000

## 2024-10-16 MED ORDER — SODIUM CHLORIDE 0.9 % IV SOLN: 2.0000 g | INTRAVENOUS | Status: DC

## 2024-10-16 MED ORDER — CHLORHEXIDINE GLUCONATE CLOTH 2 % EX PADS
6.0000 | MEDICATED_PAD | Freq: Every day | CUTANEOUS | Status: DC
  Administered 2024-10-16 – 2024-10-17 (×2): 6 via TOPICAL
  Filled 2024-10-16: qty 6

## 2024-10-16 MED ORDER — DONEPEZIL HCL 5 MG PO TABS: 5.0000 mg | ORAL_TABLET | Freq: Every day | ORAL | Status: DC

## 2024-10-16 MED ORDER — SODIUM BICARBONATE 8.4 % IV SOLN
100.0000 meq | Freq: Once | INTRAVENOUS | Status: AC
  Administered 2024-10-16: 04:00:00 100 meq via INTRAVENOUS
  Filled 2024-10-16: qty 100

## 2024-10-16 MED ORDER — VANCOMYCIN HCL 1750 MG/350ML IV SOLN
1750.0000 mg | Freq: Once | INTRAVENOUS | Status: AC
  Administered 2024-10-16: 06:00:00 1750 mg via INTRAVENOUS
  Filled 2024-10-16: qty 350

## 2024-10-16 MED ORDER — DONEPEZIL HCL 5 MG PO TABS
10.0000 mg | ORAL_TABLET | Freq: Every day | ORAL | Status: DC
  Administered 2024-10-17: 21:00:00 10 mg via ORAL
  Filled 2024-10-16: qty 2

## 2024-10-16 MED ORDER — VANCOMYCIN VARIABLE DOSE PER UNSTABLE RENAL FUNCTION (PHARMACIST DOSING): Status: DC

## 2024-10-16 MED ORDER — POTASSIUM CHLORIDE 10 MEQ/100ML IV SOLN
10.0000 meq | INTRAVENOUS | Status: AC
  Administered 2024-10-16 (×2): 10 meq via INTRAVENOUS
  Filled 2024-10-16 (×2): qty 100

## 2024-10-16 MED ORDER — INSULIN ASPART 100 UNIT/ML IJ SOLN
0.0000 [IU] | INTRAMUSCULAR | Status: DC
  Administered 2024-10-17: 3 [IU] via SUBCUTANEOUS
  Filled 2024-10-16: qty 3

## 2024-10-16 MED ORDER — INSULIN GLARGINE 100 UNIT/ML ~~LOC~~ SOLN
20.0000 [IU] | Freq: Every day | SUBCUTANEOUS | Status: DC
  Administered 2024-10-16: 22:00:00 20 [IU] via SUBCUTANEOUS
  Filled 2024-10-16: qty 0.2

## 2024-10-16 MED ORDER — METRONIDAZOLE 500 MG/100ML IV SOLN
500.0000 mg | Freq: Two times a day (BID) | INTRAVENOUS | Status: DC
  Filled 2024-10-16: qty 100

## 2024-10-16 MED ORDER — LACTATED RINGERS IV SOLN
INTRAVENOUS | Status: DC
  Administered 2024-10-16: 14:00:00 125 mL/h via INTRAVENOUS

## 2024-10-16 NOTE — Progress Notes (Addendum)
 NAME:  Alan Nunez, MRN:  [id number], DOB:  11/22/1955, LOS: 1 ADMISSION DATE:  10/15/2024, CONSULTATION DATE:  10/15/24 REFERRING MD: Rexford, CHIEF COMPLAINT:  SOB, AMS   HPI  68 year old male with a history of uncontrolled T2DM, HTN, Early onset Alzheimer's type dementia, OSA, GERD, Mitral valve disorder Ascending colitis and Arthritis who presented to the ED for shortness of breath and altered mental status. On EMS arrival, he was found to be hypotensive with systolic blood pressures in the 70s, unresponsive to initial IV fluids, and markedly hyperglycemic with capillary blood glucose >600 mg/dL.   ED Course: Initial vital signs showed HR of beats/minute, BP mm Hg, the RR 30 breaths/minute, %O2 Sat on and Temp of F (C).   Pertinent labs notable for severe DKA (glucose 1,021 mg/dL, pH 2.83, HCO? <7), hyperkalemia (>7.5 mmol/L with EKG changes), lactic acidosis (lactate 6.5-7.1), acute kidney injury (Cr 2.6), and leukocytosis (WBC up to 29). He was treated with aggressive IV fluids, insulin  infusion, calcium  gluconate, and bicarbonate. He is admitted to the ICU for management of severe DKA with shock and metabolic encephalopathy.  Past Medical History  T2DM, HTN, Early onset Alzheimer's type dementia, OSA, GERD, Mitral valve disorder Ascending colitis and Arthritis  Significant Hospital Events   12/25: Admit to ICU with severe DKA  Consults:  None  Procedures:  None  Interim History / Subjective:    Patient altered. Per nurse when wakes up gets confused and tries to pull at things.   Micro Data:  12/25: Blood culture x2> 12/25: MRSA PCR>> not detected 12/25: Strep pneumo Ur Ag 12/25: Legionella Ur Ag>  Antimicrobials:  Vancomycin  Cefepime Metronidazole   OBJECTIVE  Blood pressure (!) 117/50, pulse 95, temperature 98.8 F (37.1 C), resp. rate 18, height 5' 9 (1.753 m), weight 68.5 kg, SpO2 97%.        Intake/Output Summary (Last 24 hours) at 10/16/2024  9277 Last data filed at 10/16/2024 0630 Gross per 24 hour  Intake 3398.79 ml  Output 1000 ml  Net 2398.79 ml   Filed Weights   10/15/24 2256 10/16/24 0500  Weight: (S) 73.8 kg 68.5 kg   Physical Examination  General:  ill appearing male in nad HEENT: MM pink/dry Neuro: lethargic arouses some to stimuli but confused; perrl CV: s1s2, RRR, no m/r/g PULM:  dim clear BS bilaterally; room air GI: soft, bsx4 active  Extremities: warm/dry, trace edema  Skin: no rashes or lesions appreciated  Labs/imaging that I havepersonally reviewed  (right click and Reselect all SmartList Selections daily)   CT CHEST ABDOMEN PELVIS WO CONTRAST Result Date: 10/16/2024 EXAM: CT CHEST, ABDOMEN AND PELVIS WITHOUT CONTRAST 10/16/2024 03:18:01 AM TECHNIQUE: CT of the chest, abdomen and pelvis was performed without the administration of intravenous contrast. Multiplanar reformatted images are provided for review. Automated exposure control, iterative reconstruction, and/or weight based adjustment of the mA/kV was utilized to reduce the radiation dose to as low as reasonably achievable. COMPARISON: 11/25/2022. CLINICAL HISTORY: Possible sepsis. FINDINGS: CHEST: MEDIASTINUM AND LYMPH NODES: Heart and pericardium are unremarkable. No cardiac enlargement is seen. The central airways are clear. No mediastinal, hilar or axillary lymphadenopathy. The aorta shows atherosclerotic calcifications without aneurysmal dilatation. LUNGS AND PLEURA: The lungs are well aerated bilaterally. No focal infiltrate or sizable effusion is noted. No parenchymal nodules are seen. No pneumothorax. ABDOMEN AND PELVIS: LIVER: Liver is within normal limits. GALLBLADDER AND BILE DUCTS: The gallbladder has been surgically removed. No biliary ductal dilatation. SPLEEN: No acute abnormality. PANCREAS: No  acute abnormality. ADRENAL GLANDS: No acute abnormality. KIDNEYS, URETERS AND BLADDER: Scattered small nonobstructing renal calculi are noted  bilaterally. No hydronephrosis. No perinephric or periureteral stranding. The urinary bladder is within normal limits. GI AND BOWEL: Stomach and small bowel are within normal limits. No obstruction inflammatory changes of the colon are seen. Diverticulosis is noted without diverticulitis. The appendix is within normal limits. REPRODUCTIVE ORGANS: The prostate is within normal limits. PERITONEUM AND RETROPERITONEUM: No ascites. No free air. VASCULATURE: Aortic calcifications are noted without aneurysmal dilatation. ABDOMINAL AND PELVIS LYMPH NODES: No lymphadenopathy. BONES AND SOFT TISSUES: Postsurgical changes are noted in the cervical spine. No acute bony abnormality in the chest is seen. No acute bony abnormality is noted in the abdomen and pelvis. No focal soft tissue abnormality. IMPRESSION: 1. No acute abnormality in the chest, abdomen, or pelvis to explain sepsis. 2. Nonobstructing small  renal calculi bilaterally. Electronically signed by: Oneil Devonshire MD 10/16/2024 03:33 AM EST RP Workstation: [id number]     Labs   CBC: Recent Labs  Lab 10/15/24 2202 10/16/24 0418  WBC 22.5* 29.0*  HGB 10.3* 9.7*  HCT 35.7* 31.5*  MCV 107.5* 102.6*  PLT 254 210    Basic Metabolic Panel: Recent Labs  Lab 10/15/24 2202 10/16/24 0154 10/16/24 0418  NA 121* 126* 127*  K >7.5* 6.2* 5.4*  CL 78* 86* 86*  CO2 <7* <7* <7*  GLUCOSE 1,021* 896* 776*  BUN 56* 51* 51*  CREATININE 2.59* 2.43* 2.45*  CALCIUM  9.7 9.4 9.3  MG  --   --  2.7*   GFR: Estimated Creatinine Clearance: 28.3 mL/min (A) (by C-G formula based on SCr of 2.45 mg/dL (H)). Recent Labs  Lab 10/15/24 2202 10/16/24 0224 10/16/24 0418  PROCALCITON  --   --  1.32  WBC 22.5*  --  29.0*  LATICACIDVEN 6.5* 7.1*  --     Liver Function Tests: Recent Labs  Lab 10/15/24 2202 10/16/24 0418  AST 43* 41  ALT 66* 58*  ALKPHOS 128* 109  BILITOT 0.3 <0.2  PROT 6.2* 5.3*  ALBUMIN 4.2 3.5   Recent Labs  Lab 10/15/24 2202  LIPASE 42    No results for input(s): AMMONIA in the last 168 hours.  ABG    Component Value Date/Time   PHART 7.16 (LL) 10/16/2024 0400   PCO2ART <18 (LL) 10/16/2024 0400   PO2ART 108 10/16/2024 0400   HCO3 4.3 (L) 10/16/2024 0400   ACIDBASEDEF 21.9 (H) 10/16/2024 0400   O2SAT 99.8 10/16/2024 0400     Coagulation Profile: No results for input(s): INR, PROTIME in the last 168 hours.  Cardiac Enzymes: No results for input(s): CKTOTAL, CKMB, CKMBINDEX, TROPONINI in the last 168 hours.  HbA1C: Hgb A1c MFr Bld  Date/Time Value Ref Range Status  11/29/2022 03:53 AM 12.7 (H) 4.8 - 5.6 % Final    Comment:    (NOTE) Pre diabetes:          5.7%-6.4%  Diabetes:              >6.4%  Glycemic control for   <7.0% adults with diabetes   11/25/2022 05:00 PM 12.8 (H) 4.8 - 5.6 % Final    Comment:    (NOTE) Pre diabetes:          5.7%-6.4%  Diabetes:              >6.4%  Glycemic control for   <7.0% adults with diabetes     CBG: Recent Labs  Lab 10/16/24  9754 10/16/24 0351 10/16/24 0433 10/16/24 0512 10/16/24 0639  GLUCAP >600* >600* 592* >600* >600*    Review of Systems:   Unable to be obtained secondary to the patient's ALTERED mental status.   Past Medical History  He,  has a past medical history of Arthritis, Dementia (HCC), Diabetes mellitus without complication (HCC), Hypertension, and Sleep apnea.   Surgical History    Past Surgical History:  Procedure Laterality Date   CERVICAL SPINE SURGERY     plate and rods   COLONOSCOPY     HERNIA REPAIR     TONSILLECTOMY       Social History   reports that he has never smoked. He has never used smokeless tobacco. He reports that he does not currently use alcohol. He reports that he does not currently use drugs.   Family History   His family history is not on file.   Allergies Allergies[1]   Home Medications  Prior to Admission medications  Medication Sig Start Date End Date Taking? Authorizing Provider   acetaminophen  (TYLENOL ) 650 MG CR tablet Take 650 mg by mouth every 8 (eight) hours as needed for pain.    [provider]  amLODipine (NORVASC) 10 MG tablet Take 10 mg by mouth daily.    [provider]  cholecalciferol (VITAMIN D3) 25 MCG (1000 UNIT) tablet Take 1,000 Units by mouth daily.    [provider]  donepezil  (ARICEPT ) 5 MG tablet Take 5 mg by mouth daily.    [provider]  ibuprofen  (ADVIL ) 800 MG tablet Take 1 tablet (800 mg total) by mouth every 8 (eight) hours as needed for mild pain or moderate pain. 12/29/22   Sakai, Isami, DO  insulin  aspart (NOVOLOG ) 100 UNIT/ML FlexPen If under 100, then skip the Novolog  dose.  If 100-150, Take 4 units If 151-200, take 6 units If 201-250, take 8 unit  If 251-300, take 10 units  If 301-350, take 12 units  If 351-400, take 14 units  If over 400 take 16 units 12/29/22   Sakai, Isami, DO  losartan (COZAAR) 100 MG tablet Take 100 mg by mouth daily.    [provider]  metFORMIN (GLUCOPHAGE) 1000 MG tablet Take 1,000 mg by mouth 2 (two) times daily with a meal.    [provider]  Multiple Vitamin (MULTIVITAMIN WITH MINERALS) TABS tablet Take 1 tablet by mouth daily.    [provider]  OVER THE COUNTER MEDICATION Take 1 tablet by mouth 2 (two) times daily. Brain support    [provider]  Potassium Bicarbonate 99 MG CAPS Take 1 capsule by mouth daily.    [provider]  tamsulosin  (FLOMAX ) 0.4 MG CAPS capsule Take 1 capsule (0.4 mg total) by mouth daily as needed. 12/29/22   Sakai, Isami, DO  Scheduled Meds:  Chlorhexidine  Gluconate Cloth  6 each Topical Daily   heparin   5,000 Units Subcutaneous Q8H   vancomycin  variable dose per unstable renal function (pharmacist dosing)   Does not apply See admin instructions   Continuous Infusions:  ceFEPime (MAXIPIME) IV     dextrose  5% lactated ringers  Stopped (10/15/24 2257)   insulin  9.5 Units/hr (10/16/24 0600)    metronidazole      sodium bicarbonate  150 mEq in sterile water  1,150 mL infusion 125 mL/hr at 10/16/24 0600   vancomycin  1,750 mg (10/16/24 0621)   PRN Meds:.dextrose , docusate sodium , polyethylene glycol   Active Hospital Problem list   See systems below  Assessment & Plan:  Severe DKA T2DM -A1c 1 month ago 11.9 Plan: -Insulin  per endotool -CBG monitoring -Aggressive IV fluid resuscitation -trend B-Hydroxy -serial bmp  Acute Metabolic Encephalopathy: likely in setting of dka and possible sepsis Hx of advanced dementia - UDS negative; tsh. Ua, and ethanol wnl Plan: -limit sedating meds -treat for dka and sepsis -uds and ammonia pending -if mental status not improving; consider ct head -resume home aricept  when able to take po; consider cor track placement tomorrow if not able to take po  AKI Severe AGMA with Lactic Acidosis Hyponatremia- likely pseudohyponatremia in the setting of DKA correct for hyperglycemia Hyperkalemia corrected with sodium bicarb, calcium  gluconate and Insulin  Plan: -switch bicarb to iv fluid; cont insulin  gtt -Trend BMP / urinary output -Replace electrolytes as indicated -Avoid nephrotoxic agents, ensure adequate renal perfusion   Sepsis of unknown source: aspiration? -CT chest, abdomen and pelvis negative -Ua unremarkable -pct 1.32 Plan: -change abx to unasyn  for possible aspiration -Legionella/strep pending -F/u cultures, trend lactic -cont fluids -Monitor WBC/ fever curve  Chronic conditions HTN: Hold antihypertensives w/ soft bp GERD: PPI  Best practice:  Diet:  NPO Pain/Anxiety/Delirium protocol (if indicated): No VAP protocol (if indicated): Not indicated DVT prophylaxis: Subcutaneous Heparin  GI prophylaxis: PPI Glucose control:  Insulin  gtt Central venous access:  N/A Arterial line:  N/A Foley:  Yes, and it is still needed Mobility:  bed rest; pt/ot when appropriate PT/OT consulted: N/A Code Status:  full  code Disposition: ICU   = Goals of Care =  Primary Emergency ContactKELVIS, BERGER, Home Phone: [phone]  Palliative care consulted for GOC   Critical care time: 35 minutes     JD Emilio RIGGERS [facility] 10/16/2024, 7:40 AM  Please see [email or web address] for pager details.  From 7A-7P if no response, please call [phone]. After hours, please call ELink [phone].          [1]  Allergies Allergen Reactions   Codeine Other (See Comments)    hallucinations   Prednisone Other (See Comments)    insomnia   Morphine And Codeine Hives and Rash

## 2024-10-16 NOTE — Inpatient Diabetes Management (Signed)
 Inpatient Diabetes Program Recommendations  AACE/ADA: New Consensus Statement on Inpatient Glycemic Control   Target Ranges:  Prepandial:   less than 140 mg/dL      Peak postprandial:   less than 180 mg/dL (1-2 hours)      Critically ill patients:  140 - 180 mg/dL    Latest Reference Range & Units 10/16/24 06:39 10/16/24 07:21 10/16/24 08:11 10/16/24 09:16 10/16/24 10:15 10/16/24 11:12 10/16/24 12:13 10/16/24 13:13  Glucose-Capillary 70 - 99 mg/dL >399 (HH) 425 (HH) 473 (HH) 482 (H) 374 (H) 332 (H) 291 (H) 256 (H)    Latest Reference Range & Units 10/15/24 22:02  CO2 22 - 32 mmol/L <7 (L)  Glucose 70 - 99 mg/dL 8,978 (HH)  Anion gap 5 - 15  NOT CALCULATED    Latest Reference Range & Units 10/15/24 22:02 10/16/24 01:54 10/16/24 04:18 10/16/24 08:04 10/16/24 12:28  Beta-Hydroxybutyric Acid 0.05 - 0.27 mmol/L >8.00 (H) >8.00 (H) >8.00 (H) 7.42 (H) 3.01 (H)    Review of Glycemic Control  Diabetes history: DM2 Outpatient Diabetes medications: Tresiba  30 unist daily, Novolog  0-16 units TID with meals, FreeStyle Libre 3 Plus CGM Current orders for Inpatient glycemic control: IV insulin   Inpatient Diabetes Program Recommendations:    Insulin : IV insulin  should be continued until acidosis has completely resolved.   NOTE: Patient admitted with severe DKA, sepsis, AKI, severe AGMA with lactic acidosis, and acute metabolic encephalopathy. Per chart review, patient sees Dr. Cherilyn (Endocrinologist) for DM2 management and was last seen on 08/22/24. Per office note on 08/22/24 patient was advised to:    Keep taking your Metformin. Let's increase your Tresiba  to 30 units once a day. Keep taking the novolog  sliding scale with meals If under 120, take 4 units If 120-200, Take 8 units If 201-300, take 12 unit  If 301-400, take 16 units  If over 400 take 20 units. **You need to take the 4 units even if under 120 if you are eating a full meal, otherwise you'll go up to 300 after the meal and  stay there for several hours.  Will plan to follow up with patient and/or family on 10/17/24 regarding DM control.  Thanks, Earnie Gainer, RN, MSN, CDCES Diabetes Coordinator Inpatient Diabetes Program [phone] (Team Pager from 8am to 5pm)

## 2024-10-16 NOTE — H&P (Addendum)
 NAME:  Alan Nunez, MRN:  [id number], DOB:  01-15-1956, LOS: 1 ADMISSION DATE:  10/15/2024, CONSULTATION DATE:  10/15/24 REFERRING MD: Rexford, CHIEF COMPLAINT:  SOB, AMS   HPI  68 year old male with a history of uncontrolled T2DM, HTN, Early onset Alzheimer's type dementia, OSA, GERD, Mitral valve disorder Ascending colitis and Arthritis who presented to the ED for shortness of breath and altered mental status. On EMS arrival, he was found to be hypotensive with systolic blood pressures in the 70s, unresponsive to initial IV fluids, and markedly hyperglycemic with capillary blood glucose >600 mg/dL.   ED Course: Initial vital signs showed HR of beats/minute, BP mm Hg, the RR 30 breaths/minute, %O2 Sat on and Temp of F (C).   Pertinent labs notable for severe DKA (glucose 1,021 mg/dL, pH 2.83, HCO? <7), hyperkalemia (>7.5 mmol/L with EKG changes), lactic acidosis (lactate 6.5-7.1), acute kidney injury (Cr 2.6), and leukocytosis (WBC up to 29). He was treated with aggressive IV fluids, insulin  infusion, calcium  gluconate, and bicarbonate. He is admitted to the ICU for management of severe DKA with shock and metabolic encephalopathy.  Past Medical History  T2DM, HTN, Early onset Alzheimer's type dementia, OSA, GERD, Mitral valve disorder Ascending colitis and Arthritis  Significant Hospital Events   12/25: Admit to ICU with severe DKA  Consults:  None  Procedures:  None  Interim History / Subjective:    -see above  Micro Data:  12/25: Blood culture x2> 12/25: MRSA PCR>>  12/25: Strep pneumo Ur Ag 12/25: Legionella Ur Ag>  Antimicrobials:  Vancomycin  Cefepime Metronidazole   OBJECTIVE  Blood pressure (!) 112/50, pulse (!) 101, temperature (!) 95 F (35 C), resp. rate 18, height 5' 9 (1.753 m), weight (S) 73.8 kg, SpO2 100%.        Intake/Output Summary (Last 24 hours) at 10/16/2024 0527 Last data filed at 10/16/2024 0344 Gross per 24 hour  Intake 3000 ml   Output 700 ml  Net 2300 ml   Filed Weights   10/15/24 2256  Weight: (S) 73.8 kg   Physical Examination  GEN: Critically ill patient, WDWN in NAD Lethargic HEENT: [place]/AT. PERRL, sclerae anicteric. HEART: regular rhythm, normal rate, S1, S2, no M/R/G,  LUNGS: CTAB, mild crackles without wheezes, kussmal type breathing EXTREMITIES: dependent pedal Edema, cap refill  NEURO: No gross focal deficits. PSYCH:UTA ABDOMINAL: Soft: BS x 4, NTND SKIN: Intact, warm, no rashes lesion, or ulcer  Labs/imaging that I havepersonally reviewed  (right click and Reselect all SmartList Selections daily)   CT CHEST ABDOMEN PELVIS WO CONTRAST Result Date: 10/16/2024 EXAM: CT CHEST, ABDOMEN AND PELVIS WITHOUT CONTRAST 10/16/2024 03:18:01 AM TECHNIQUE: CT of the chest, abdomen and pelvis was performed without the administration of intravenous contrast. Multiplanar reformatted images are provided for review. Automated exposure control, iterative reconstruction, and/or weight based adjustment of the mA/kV was utilized to reduce the radiation dose to as low as reasonably achievable. COMPARISON: 11/25/2022. CLINICAL HISTORY: Possible sepsis. FINDINGS: CHEST: MEDIASTINUM AND LYMPH NODES: Heart and pericardium are unremarkable. No cardiac enlargement is seen. The central airways are clear. No mediastinal, hilar or axillary lymphadenopathy. The aorta shows atherosclerotic calcifications without aneurysmal dilatation. LUNGS AND PLEURA: The lungs are well aerated bilaterally. No focal infiltrate or sizable effusion is noted. No parenchymal nodules are seen. No pneumothorax. ABDOMEN AND PELVIS: LIVER: Liver is within normal limits. GALLBLADDER AND BILE DUCTS: The gallbladder has been surgically removed. No biliary ductal dilatation. SPLEEN: No acute abnormality. PANCREAS: No acute abnormality. ADRENAL GLANDS: No  acute abnormality. KIDNEYS, URETERS AND BLADDER: Scattered small nonobstructing renal calculi are noted  bilaterally. No hydronephrosis. No perinephric or periureteral stranding. The urinary bladder is within normal limits. GI AND BOWEL: Stomach and small bowel are within normal limits. No obstruction inflammatory changes of the colon are seen. Diverticulosis is noted without diverticulitis. The appendix is within normal limits. REPRODUCTIVE ORGANS: The prostate is within normal limits. PERITONEUM AND RETROPERITONEUM: No ascites. No free air. VASCULATURE: Aortic calcifications are noted without aneurysmal dilatation. ABDOMINAL AND PELVIS LYMPH NODES: No lymphadenopathy. BONES AND SOFT TISSUES: Postsurgical changes are noted in the cervical spine. No acute bony abnormality in the chest is seen. No acute bony abnormality is noted in the abdomen and pelvis. No focal soft tissue abnormality. IMPRESSION: 1. No acute abnormality in the chest, abdomen, or pelvis to explain sepsis. 2. Nonobstructing small  renal calculi bilaterally. Electronically signed by: Oneil Devonshire MD 10/16/2024 03:33 AM EST RP Workstation: [id number]     Labs   CBC: Recent Labs  Lab 10/15/24 2202 10/16/24 0418  WBC 22.5* 29.0*  HGB 10.3* 9.7*  HCT 35.7* 31.5*  MCV 107.5* 102.6*  PLT 254 210    Basic Metabolic Panel: Recent Labs  Lab 10/15/24 2202 10/16/24 0154  NA 121* 126*  K >7.5* 6.2*  CL 78* 86*  CO2 <7* <7*  GLUCOSE 1,021* 896*  BUN 56* 51*  CREATININE 2.59* 2.43*  CALCIUM  9.7 9.4   GFR: Estimated Creatinine Clearance: 29.5 mL/min (A) (by C-G formula based on SCr of 2.43 mg/dL (H)). Recent Labs  Lab 10/15/24 2202 10/16/24 0224 10/16/24 0418  WBC 22.5*  --  29.0*  LATICACIDVEN 6.5* 7.1*  --     Liver Function Tests: Recent Labs  Lab 10/15/24 2202  AST 43*  ALT 66*  ALKPHOS 128*  BILITOT 0.3  PROT 6.2*  ALBUMIN 4.2   Recent Labs  Lab 10/15/24 2202  LIPASE 42   No results for input(s): AMMONIA in the last 168 hours.  ABG    Component Value Date/Time   PHART 7.16 (LL) 10/16/2024 0400    PCO2ART <18 (LL) 10/16/2024 0400   PO2ART 108 10/16/2024 0400   HCO3 4.3 (L) 10/16/2024 0400   ACIDBASEDEF 21.9 (H) 10/16/2024 0400   O2SAT 99.8 10/16/2024 0400     Coagulation Profile: No results for input(s): INR, PROTIME in the last 168 hours.  Cardiac Enzymes: No results for input(s): CKTOTAL, CKMB, CKMBINDEX, TROPONINI in the last 168 hours.  HbA1C: Hgb A1c MFr Bld  Date/Time Value Ref Range Status  11/29/2022 03:53 AM 12.7 (H) 4.8 - 5.6 % Final    Comment:    (NOTE) Pre diabetes:          5.7%-6.4%  Diabetes:              >6.4%  Glycemic control for   <7.0% adults with diabetes   11/25/2022 05:00 PM 12.8 (H) 4.8 - 5.6 % Final    Comment:    (NOTE) Pre diabetes:          5.7%-6.4%  Diabetes:              >6.4%  Glycemic control for   <7.0% adults with diabetes     CBG: Recent Labs  Lab 10/16/24 0148 10/16/24 0245 10/16/24 0351 10/16/24 0433 10/16/24 0512  GLUCAP >600* >600* >600* 592* >600*    Review of Systems:   Unable to be obtained secondary to the patient's ALTERED mental status.   Past Medical History  He,  has a past medical history of Arthritis, Dementia (HCC), Diabetes mellitus without complication (HCC), Hypertension, and Sleep apnea.   Surgical History    Past Surgical History:  Procedure Laterality Date   CERVICAL SPINE SURGERY     plate and rods   COLONOSCOPY     HERNIA REPAIR     TONSILLECTOMY       Social History   reports that he has never smoked. He has never used smokeless tobacco. He reports that he does not currently use alcohol. He reports that he does not currently use drugs.   Family History   His family history is not on file.   Allergies Allergies[1]   Home Medications  Prior to Admission medications  Medication Sig Start Date End Date Taking? Authorizing Provider  acetaminophen  (TYLENOL ) 650 MG CR tablet Take 650 mg by mouth every 8 (eight) hours as needed for pain.    [provider]   amLODipine (NORVASC) 10 MG tablet Take 10 mg by mouth daily.    [provider]  cholecalciferol (VITAMIN D3) 25 MCG (1000 UNIT) tablet Take 1,000 Units by mouth daily.    [provider]  donepezil  (ARICEPT ) 5 MG tablet Take 5 mg by mouth daily.    [provider]  ibuprofen  (ADVIL ) 800 MG tablet Take 1 tablet (800 mg total) by mouth every 8 (eight) hours as needed for mild pain or moderate pain. 12/29/22   Sakai, Isami, DO  insulin  aspart (NOVOLOG ) 100 UNIT/ML FlexPen If under 100, then skip the Novolog  dose.  If 100-150, Take 4 units If 151-200, take 6 units If 201-250, take 8 unit  If 251-300, take 10 units  If 301-350, take 12 units  If 351-400, take 14 units  If over 400 take 16 units 12/29/22   Sakai, Isami, DO  losartan (COZAAR) 100 MG tablet Take 100 mg by mouth daily.    [provider]  metFORMIN (GLUCOPHAGE) 1000 MG tablet Take 1,000 mg by mouth 2 (two) times daily with a meal.    [provider]  Multiple Vitamin (MULTIVITAMIN WITH MINERALS) TABS tablet Take 1 tablet by mouth daily.    [provider]  OVER THE COUNTER MEDICATION Take 1 tablet by mouth 2 (two) times daily. Brain support    [provider]  Potassium Bicarbonate 99 MG CAPS Take 1 capsule by mouth daily.    [provider]  tamsulosin  (FLOMAX ) 0.4 MG CAPS capsule Take 1 capsule (0.4 mg total) by mouth daily as needed. 12/29/22   Sakai, Isami, DO  Scheduled Meds:  Chlorhexidine  Gluconate Cloth  6 each Topical Daily   heparin   5,000 Units Subcutaneous Q8H   vancomycin  variable dose per unstable renal function (pharmacist dosing)   Does not apply See admin instructions   Continuous Infusions:  ceFEPime (MAXIPIME) IV     dextrose  5% lactated ringers  Stopped (10/15/24 2257)   insulin  9.5 Units/hr (10/16/24 0515)   metronidazole      sodium bicarbonate  150 mEq in sterile water  1,150 mL infusion 125 mL/hr at 10/16/24 0251   vancomycin      PRN  Meds:.dextrose , docusate sodium , polyethylene glycol   Active Hospital Problem list   See systems below  Assessment & Plan:  #Severe DKA #T2DM -UA negative, Cultures for infection pending -EKG without ischemia -Lipase negative for pancreatitis -Received Insulin  (regular) 0.1u/kg (~10 units) IV x1. Continue Insulin  drip, DKA protocol -Keep NPO -Q1h Glucose check to titrate insulin  -Q4h BMP+mag -Hold home meds -Diabetes  coordinator consult    #Acute Kidney Injury #Severe AGMA with Lactic Acidosis #Hyponatremia- likely pseudohyponatremia in the setting of DKA correct for hyperglycemia #Hyperkalemia corrected with sodium bicarb, calcium  gluconate and Insulin  -Follow BMP+Mag -Ensure adequate renal perfusion -Avoid nephrotoxic  -Give 2 amps of sodium Bicarb and start sodium bicarb gtt  -Replace lytes -Strict I/O's   #Sepsis shock of unknown source CT chest, abdomen and pelvis pending -F/u cultures, trend lactic/ PCT -Monitor WBC/ fever curve -Pressor for MAP Goal >65 mm Hg -Obtain MRSA nasal swab, RVP, legionella, strep urine Ag -Will start broad sprectum IV antibiotics pending above  #Acute Metabolic Encephalopathy -Check TSH, UDS, ammonia, thiamine/B12 -Follow Infectious Workup: UA/UCx, bladder scan, CXR, BCx,  -CTH if worsening AMS -Avoid sedatives  Chronic conditions HTN: Hold antihypertensives iso aki and hypotension GERD: Start PPI Dementia: Hold Aricept   Best practice:  Diet:  NPO Pain/Anxiety/Delirium protocol (if indicated): No VAP protocol (if indicated): Not indicated DVT prophylaxis: Subcutaneous Heparin  GI prophylaxis: PPI Glucose control:  Insulin  gtt Central venous access:  N/A Arterial line:  N/A Foley:  Yes, and it is still needed Mobility:  bed rest  PT/OT consulted: N/A Code Status:  full code Disposition: ICU   = Goals of Care =  Primary Emergency ContactEMMANUELLE, COXE, Home Phone: [phone]   Critical care time: 55 minutes         Almarie Nose DNP, CCRN, FNP-C, AGACNP-BC Acute Care & Family Nurse Practitioner [facility] PCCM on call pager [phone]         [1]  Allergies Allergen Reactions   Codeine Other (See Comments)    hallucinations   Prednisone Other (See Comments)    insomnia   Morphine And Codeine Hives and Rash

## 2024-10-16 NOTE — Progress Notes (Signed)
 PHARMACY CONSULT NOTE - FOLLOW UP  Pharmacy Consult for Electrolyte Monitoring and Replacement   Recent Labs: Potassium (mmol/L)  Date Value  10/16/2024 3.9  12/14/2011 4.0   Magnesium (mg/dL)  Date Value  87/83/7974 2.7 (H)   Calcium  (mg/dL)  Date Value  87/83/7974 10.1   Calcium , Total (mg/dL)  Date Value  97/87/7986 9.4   Albumin (g/dL)  Date Value  87/83/7974 3.5  12/14/2011 4.3   Phosphorus (mg/dL)  Date Value  98/69/7975 2.6   Sodium (mmol/L)  Date Value  10/16/2024 136  12/14/2011 136    Assessment: 68 year old male with a history of uncontrolled T2DM, HTN, Early onset Alzheimer's type dementia, OSA, GERD, Mitral valve disorder Ascending colitis and Arthritis who presented to the ED for shortness of breath and altered mental status. Pharmacy is asked to follow and replace electrolytes while in CCU  Goal of Therapy:  Electrolytes WNL  Plan:  ---no electrolyte replacement warranted for today ---BMP q4h while on IV insulin   Adriana JONETTA Bolster ,PharmD Clinical Pharmacist 10/16/2024 1:53 PM

## 2024-10-16 NOTE — Progress Notes (Signed)
 Pharmacy Antibiotic Note  Alan Nunez is a 68 y.o. male admitted on 10/15/2024 with sepsis.  Pharmacy has been consulted for Vancomycin , Cefepime dosing.  Pt appears to be AKI secondary to septic shock.  Will dose Vancomycin  by levels til renal function improves.   Plan: Cefepime 2 gm IV Q24H ordered to start on 12/16 @ ~ 0530.  Vancomycin  1750 mg IV X 1 loading dose ordered for 12/16 @ ~ 0600. - Will draw Vanc random on 12/17 @ 0600.  - Goal trough :  15 - 20 mcg/mL    Height: 5' 9 (175.3 cm) Weight: (S) 73.8 kg (162 lb 11.2 oz) IBW/kg (Calculated) : 70.7  Temp (24hrs), Avg:96.6 F (35.9 C), Min:96 F (35.6 C), Max:97.2 F (36.2 C)  Recent Labs  Lab 10/15/24 2202 10/16/24 0154 10/16/24 0224 10/16/24 0418  WBC 22.5*  --   --  29.0*  CREATININE 2.59* 2.43*  --   --   LATICACIDVEN 6.5*  --  7.1*  --     Estimated Creatinine Clearance: 29.5 mL/min (A) (by C-G formula based on SCr of 2.43 mg/dL (H)).    Allergies[1]  Antimicrobials this admission:   >>    >>   Dose adjustments this admission:   Microbiology results:  BCx:   UCx:    Sputum:    MRSA PCR:   Thank you for allowing pharmacy to be a part of this patients care.  Alan Nunez D 10/16/2024 5:16 AM     [1]  Allergies Allergen Reactions   Codeine Other (See Comments)    hallucinations   Prednisone Other (See Comments)    insomnia   Morphine And Codeine Hives and Rash

## 2024-10-16 NOTE — Progress Notes (Signed)
 eLink Physician-Brief Progress Note Patient Name: Alan Nunez DOB: 1956-03-07 MRN: [id number]   Date of Service  10/16/2024  HPI/Events of Note  Adm with severe DKA, AKI, encephalopathy As expected hyperkalemia improved with Rx On empiric Abx for leucocytosis, imaging neg ,no source apparent AG & bHB remain high, sugars better  eICU Interventions  Ct DKA protocol with BMET monitoring     Intervention Category Evaluation Type: New Patient Evaluation  Ryley Bachtel V. Franchot Pollitt 10/16/2024, 6:31 AM

## 2024-10-16 NOTE — Progress Notes (Signed)
 Lab called with critical glucose of 776. Provider notified via secure chat

## 2024-10-16 NOTE — Progress Notes (Addendum)
 Remains confused and combative. Code status changed to DNR/DNI. Remains on insulin  drip with every hour CBGs. Does not talk to wife. Speech garbled. Attempted oral care every 4 hours but patient fights nurse.

## 2024-10-16 NOTE — ED Notes (Signed)
 Per recommendation of CC NP bear hugger started after rectal temp of 96 adjusted to medium temp 38 C.

## 2024-10-16 NOTE — ED Notes (Signed)
 Pt transported to and from CT by this RN on vs monitor

## 2024-10-16 NOTE — IPAL (Signed)
°  Interdisciplinary Goals of Care Family Meeting   Date carried out: 10/16/2024  Location of the meeting: Bedside  Member's involved: Family Member or next of kin and Other: PA-C  Durable Power of Insurance risk surveyor: wife Alan Nunez  Discussion: We discussed goals of care for Alan Nunez .  Spoke w/ Alan Nunez (wife) at bedside. She states he would not want to be intubated or want CPR performed. Wife agreed to make DNR. Continue supportive care.   Code status:   Code Status: Limited: Do not attempt resuscitation (DNR) -DNR-LIMITED -Do Not Intubate/DNI    Disposition: Continue current acute care  Time spent for the meeting: 35 minutes    Norleen JONETTA Cedar, PA-C  10/16/2024, 11:37 AM

## 2024-10-17 DIAGNOSIS — I10 Essential (primary) hypertension: Secondary | ICD-10-CM | POA: Insufficient documentation

## 2024-10-17 DIAGNOSIS — R7989 Other specified abnormal findings of blood chemistry: Secondary | ICD-10-CM | POA: Diagnosis not present

## 2024-10-17 DIAGNOSIS — F02B0 Dementia in other diseases classified elsewhere, moderate, without behavioral disturbance, psychotic disturbance, mood disturbance, and anxiety: Secondary | ICD-10-CM

## 2024-10-17 DIAGNOSIS — E872 Acidosis, unspecified: Secondary | ICD-10-CM | POA: Insufficient documentation

## 2024-10-17 DIAGNOSIS — G300 Alzheimer's disease with early onset: Secondary | ICD-10-CM

## 2024-10-17 DIAGNOSIS — F0390 Unspecified dementia without behavioral disturbance: Secondary | ICD-10-CM | POA: Diagnosis not present

## 2024-10-17 DIAGNOSIS — E1110 Type 2 diabetes mellitus with ketoacidosis without coma: Secondary | ICD-10-CM

## 2024-10-17 DIAGNOSIS — E1010 Type 1 diabetes mellitus with ketoacidosis without coma: Secondary | ICD-10-CM | POA: Diagnosis not present

## 2024-10-17 DIAGNOSIS — E875 Hyperkalemia: Secondary | ICD-10-CM | POA: Diagnosis not present

## 2024-10-17 DIAGNOSIS — Z515 Encounter for palliative care: Secondary | ICD-10-CM

## 2024-10-17 LAB — COMPREHENSIVE METABOLIC PANEL WITH GFR
ALT: 59 U/L — ABNORMAL HIGH (ref 0–44)
AST: 73 U/L — ABNORMAL HIGH (ref 15–41)
Albumin: 3.2 g/dL — ABNORMAL LOW (ref 3.5–5.0)
Alkaline Phosphatase: 109 U/L (ref 38–126)
Anion gap: 8 (ref 5–15)
BUN: 35 mg/dL — ABNORMAL HIGH (ref 8–23)
CO2: 29 mmol/L (ref 22–32)
Calcium: 9.8 mg/dL (ref 8.9–10.3)
Chloride: 103 mmol/L (ref 98–111)
Creatinine, Ser: 1.58 mg/dL — ABNORMAL HIGH (ref 0.61–1.24)
GFR, Estimated: 48 mL/min — ABNORMAL LOW (ref >60)
Glucose, Bld: 82 mg/dL (ref 70–99)
Potassium: 3.5 mmol/L (ref 3.5–5.1)
Sodium: 141 mmol/L (ref 135–145)
Total Bilirubin: <0.2 mg/dL (ref 0.0–1.2)
Total Protein: 5.1 g/dL — ABNORMAL LOW (ref 6.5–8.1)

## 2024-10-17 LAB — CBC
HCT: 34.1 % — ABNORMAL LOW (ref 39.0–52.0)
Hemoglobin: 11.2 g/dL — ABNORMAL LOW (ref 13.0–17.0)
MCH: 30.9 pg (ref 26.0–34.0)
MCHC: 32.8 g/dL (ref 30.0–36.0)
MCV: 93.9 fL (ref 80.0–100.0)
Platelets: 161 K/uL (ref 150–400)
RBC: 3.63 MIL/uL — ABNORMAL LOW (ref 4.22–5.81)
RDW: 13.4 % (ref 11.5–15.5)
WBC: 14.3 K/uL — ABNORMAL HIGH (ref 4.0–10.5)
nRBC: 0 % (ref 0.0–0.2)

## 2024-10-17 LAB — GLUCOSE, CAPILLARY
Glucose-Capillary: 88 mg/dL (ref 70–99)
Glucose-Capillary: 35 mg/dL — CL (ref 70–99)
Glucose-Capillary: 89 mg/dL (ref 70–99)
Glucose-Capillary: 178 mg/dL — ABNORMAL HIGH (ref 70–99)
Glucose-Capillary: 234 mg/dL — ABNORMAL HIGH (ref 70–99)
Glucose-Capillary: 261 mg/dL — ABNORMAL HIGH (ref 70–99)

## 2024-10-17 LAB — HEMOGLOBIN A1C
Hgb A1c MFr Bld: 12.3 % — ABNORMAL HIGH (ref 4.8–5.6)
Mean Plasma Glucose: 306.31 mg/dL

## 2024-10-17 LAB — LACTIC ACID, PLASMA: Lactic Acid, Venous: 1 mmol/L (ref 0.5–1.9)

## 2024-10-17 LAB — MAGNESIUM: Magnesium: 2.4 mg/dL (ref 1.7–2.4)

## 2024-10-17 LAB — PHOSPHORUS: Phosphorus: 2 mg/dL — ABNORMAL LOW (ref 2.5–4.6)

## 2024-10-17 MED ORDER — INSULIN ASPART 100 UNIT/ML IJ SOLN: 0.0000 [IU] | Freq: Three times a day (TID) | INTRAMUSCULAR | Status: DC

## 2024-10-17 MED ORDER — POTASSIUM PHOSPHATES 15 MMOLE/5ML IV SOLN
15.0000 mmol | Freq: Once | INTRAVENOUS | Status: AC
  Administered 2024-10-17: 06:00:00 15 mmol via INTRAVENOUS
  Filled 2024-10-17: qty 5

## 2024-10-17 MED ORDER — INSULIN GLARGINE 100 UNIT/ML ~~LOC~~ SOLN
10.0000 [IU] | Freq: Every day | SUBCUTANEOUS | Status: DC
  Administered 2024-10-17: 21:00:00 10 [IU] via SUBCUTANEOUS
  Filled 2024-10-17: qty 0.1

## 2024-10-17 MED ORDER — INSULIN ASPART 100 UNIT/ML IJ SOLN: 0.0000 [IU] | Freq: Every day | INTRAMUSCULAR | Status: DC

## 2024-10-17 MED ORDER — INSULIN ASPART 100 UNIT/ML IJ SOLN
0.0000 [IU] | INTRAMUSCULAR | Status: DC
  Administered 2024-10-17: 18:00:00 2 [IU] via SUBCUTANEOUS
  Administered 2024-10-17: 12:00:00 1 [IU] via SUBCUTANEOUS
  Administered 2024-10-17: 21:00:00 3 [IU] via SUBCUTANEOUS
  Administered 2024-10-18: 2 [IU] via SUBCUTANEOUS
  Administered 2024-10-18: 04:00:00 1 [IU] via SUBCUTANEOUS
  Filled 2024-10-17: qty 3
  Filled 2024-10-17: qty 1
  Filled 2024-10-17: qty 2
  Filled 2024-10-17: qty 1
  Filled 2024-10-17: qty 2

## 2024-10-17 NOTE — Assessment & Plan Note (Signed)
 Initially came in with diabetic ketoacidosis and was placed on insulin  drip was given insulin  last night but had a low sugar yesterday morning.  Patient feeling much better today.  Case discussed with diabetes coordinator recommended 13 units of Tresiba  insulin  at night and sliding scale insulin  for short acting.  Diabetes coordinator came to teach wife how to inject insulin  also.

## 2024-10-17 NOTE — Hospital Course (Signed)
 68 year old male with a history of uncontrolled T2DM, HTN, Early onset Alzheimer's type dementia, OSA, GERD, Mitral valve disorder Ascending colitis and Arthritis who presented to the ED for shortness of breath and altered mental status. On EMS arrival, he was found to be hypotensive with systolic blood pressures in the 70s, unresponsive to initial IV fluids, and markedly hyperglycemic with capillary blood glucose >600 mg/dL.   ED Course: Initial vital signs showed HR of beats/minute, BP mm Hg, the RR 30 breaths/minute, %O2 Sat on and Temp of F (C).    Pertinent labs notable for severe DKA (glucose 1,021 mg/dL, pH 2.83, HCO? <7), hyperkalemia (>7.5 mmol/L with EKG changes), lactic acidosis (lactate 6.5-7.1), acute kidney injury (Cr 2.6), and leukocytosis (WBC up to 29). He was treated with aggressive IV fluids, insulin  infusion, calcium  gluconate, and bicarbonate. He is admitted to the ICU for management of severe DKA with shock and metabolic encephalopathy.  12/17.  Patient had a low sugar this morning but was asymptomatic.  Given some juice and food.  Will cut back insulin  this evening and put on sliding scale and monitor every 4 hours for now. 12/18.  Patient feeling fine.  Offers no complaints.  Likely blood cultures are contaminant but will give Augmentin  to go home with.  Diabetes coordinator recommended long-acting insulin  13 units nightly and short acting insulin  sliding scale prior to meals.

## 2024-10-17 NOTE — Care Management Important Message (Signed)
 Important Message  Patient Details  Name: Alan Nunez MRN: [id number] Date of Birth: 29-Mar-1956   Important Message Given:  Yes - Medicare IM     Rojelio SHAUNNA Rattler 10/17/2024, 4:35 PM

## 2024-10-17 NOTE — Evaluation (Addendum)
 Physical Therapy Evaluation Patient Details Name: ANTOIN DARGIS MRN: [id number] DOB: 1956-05-27 Today's Date: 10/17/2024  History of Present Illness  68 y/o male presented to ED on 10/15/24 for SOB and AMS. Admitted for severe DKA. PMH: uncontrolled T2DM, early onset Alzheimer's, OSA, mitral valve disorder  Clinical Impression  PT/OT cotx on this date. Pt received in semi-supine with wife at bedside. He was able to complete all bed mobility with supervision with reports of transient light-headedness upon position change. Required MIN/CGA for initial STS for increased steadiness. Pt ambulated 216ft total, first half with 1HHA, second half no physical assist. 2+ for IV pole management and recliner follow.  Pt does demonstrate increased unsteadiness and reaching for surfaces without HHA and frequent vc to not hold IV pole. Pt returned to bed with all needs met and wife at bedside. He will continue to benefit from skilled PT services to address functional strength, balance, and potential DME edu.      If plan is discharge home, recommend the following: A little help with walking and/or transfers;A little help with bathing/dressing/bathroom;Supervision due to cognitive status;Direct supervision/assist for medications management;Direct supervision/assist for financial management;Help with stairs or ramp for entrance   Can travel by private vehicle        Equipment Recommendations Other (comment) (TBD)  Recommendations for Other Services       Functional Status Assessment Patient has had a recent decline in their functional status and demonstrates the ability to make significant improvements in function in a reasonable and predictable amount of time.     Precautions / Restrictions Precautions Precautions: Fall Recall of Precautions/Restrictions: Intact Restrictions Weight Bearing Restrictions Per Provider Order: No      Mobility  Bed Mobility Overal bed mobility: Needs Assistance Bed  Mobility: Supine to Sit, Sit to Supine     Supine to sit: Supervision Sit to supine: Supervision        Transfers Overall transfer level: Needs assistance Equipment used: 1 person hand held assist Transfers: Sit to/from Stand Sit to Stand: Min assist, Contact guard assist           General transfer comment: CGA/MIN for initial STS for steadiness    Ambulation/Gait Ambulation/Gait assistance: Contact guard assist, +2 physical assistance Gait Distance (Feet): 200 Feet Assistive device: 1 person hand held assist Gait Pattern/deviations: Step-through pattern, Decreased stride length, Narrow base of support       General Gait Details: 1 HHA with progression to CGA; 2+ assist for IV pole management  Stairs            Wheelchair Mobility     Tilt Bed    Modified Rankin (Stroke Patients Only)       Balance Overall balance assessment: Needs assistance Sitting-balance support: Feet supported, No upper extremity supported Sitting balance-Leahy Scale: Good     Standing balance support: Single extremity supported, No upper extremity supported, During functional activity Standing balance-Leahy Scale: Fair Standing balance comment: able to amb without HHA but demonstrates increased unsteadines/reaching for surfaces                             Pertinent Vitals/Pain Pain Assessment Pain Assessment: No/denies pain    Home Living Family/patient expects to be discharged to:: Private residence Living Arrangements: Spouse/significant other Available Help at Discharge: Family Type of Home: Mobile home Home Access: Stairs to enter Entrance Stairs-Rails: Right Entrance Stairs-Number of Steps: 2   Home Layout: One level Home  Equipment: [employer] - single point Additional Comments: wife available prn/intermittently    Prior Function Prior Level of Function : Independent/Modified Independent             Mobility Comments: amb around yard with recent fall  and unsteadiness ADLs Comments: IND     Extremity/Trunk Assessment   Upper Extremity Assessment Upper Extremity Assessment: Overall WFL for tasks assessed    Lower Extremity Assessment Lower Extremity Assessment: Overall WFL for tasks assessed    Cervical / Trunk Assessment Cervical / Trunk Assessment: Kyphotic  Communication   Communication Communication: Impaired Factors Affecting Communication: Reduced clarity of speech    Cognition Arousal: Alert Behavior During Therapy: Impulsive   PT - Cognitive impairments: History of cognitive impairments, Attention, Initiation, Sequencing, Problem solving, Safety/Judgement                       PT - Cognition Comments: wife at bedside confirmed history/home setup Following commands: Impaired Following commands impaired: Follows one step commands inconsistently     Cueing Cueing Techniques: Verbal cues, Gestural cues     General Comments      Exercises     Assessment/Plan    PT Assessment Patient needs continued PT services  PT Problem List Decreased strength;Decreased activity tolerance;Decreased balance;Decreased mobility;Decreased coordination;Decreased cognition;Decreased safety awareness;Decreased knowledge of use of DME       PT Treatment Interventions DME instruction;Gait training;Stair training;Functional mobility training;Therapeutic activities;Therapeutic exercise;Balance training;Patient/family education    PT Goals (Current goals can be found in the Care Plan section)  Acute Rehab PT Goals Patient Stated Goal: none stated PT Goal Formulation: Patient unable to participate in goal setting Time For Goal Achievement: 10/31/24 Potential to Achieve Goals: Good    Frequency Min 2X/week     Co-evaluation PT/OT/SLP Co-Evaluation/Treatment: Yes Reason for Co-Treatment: To address functional/ADL transfers PT goals addressed during session: Mobility/safety with mobility OT goals addressed during  session: ADL's and self-care       AM-PAC PT 6 Clicks Mobility  Outcome Measure Help needed turning from your back to your side while in a flat bed without using bedrails?: None Help needed moving from lying on your back to sitting on the side of a flat bed without using bedrails?: None Help needed moving to and from a bed to a chair (including a wheelchair)?: A Little Help needed standing up from a chair using your arms (e.g., wheelchair or bedside chair)?: A Little Help needed to walk in hospital room?: A Little Help needed climbing 3-5 steps with a railing? : A Little 6 Click Score: 20    End of Session Equipment Utilized During Treatment: Gait belt Activity Tolerance: Patient tolerated treatment well Patient left: in bed;with call bell/phone within reach;with bed alarm set;with family/visitor present Nurse Communication: Mobility status PT Visit Diagnosis: Unsteadiness on feet (R26.81);Other abnormalities of gait and mobility (R26.89);History of falling (Z91.81);Difficulty in walking, not elsewhere classified (R26.2)    Time: 8843-8788 PT Time Calculation (min) (ACUTE ONLY): 15 min   Charges:                 Allena Bulls, SPT  Maryanne Finder, PT, DPT Physical Therapist - [facility]  [facility] 10/17/2024, 12:42 PM

## 2024-10-17 NOTE — Assessment & Plan Note (Signed)
 Initially blood pressure medications were held but can be restarted as outpatient.

## 2024-10-17 NOTE — Progress Notes (Signed)
 Telephone report called to [facility] RN.

## 2024-10-17 NOTE — Progress Notes (Signed)
 Hypoglycemic Event  CBG: 35  Treatment: 8 oz juice/soda  Symptoms: sleepy  Follow-up CBG: Time:08:39 CBG Result:89  Possible Reasons for Event: Medication regimen:    Comments/MD notified:Dr. Ginnie aware    Alan Nunez

## 2024-10-17 NOTE — Assessment & Plan Note (Signed)
 Improved.

## 2024-10-17 NOTE — Consult Note (Signed)
 Consultation Note Date: 10/17/2024   Patient Name: Alan Nunez  DOB: Jul 24, 1956  MRN: [id number]  Age / Sex: 68 y.o., male  PCP: Sherial Bail, MD Referring Physician: Josette Ade, MD  Reason for Consultation:  GOC  HPI/Patient Profile: 68 y.o. male  with past medical history of DM2, HTN, early onset Alz Dementia, OSA, GERD, ascending colitis, arthritis admitted on 10/15/2024 with SOB, AMS, hypotensive. Workup revealed DKA. Palliative medicine consulted for GOC.    Primary Decision Maker NEXT OF KIN- spouse Alan Nunez  Discussion: Chart reviewed including labs, progress notes, imaging from this and previous encounters.  Last glucose was 178. Was greater than 600 on admission. CBC with elevated WBC 14.3. Cr was elevated on admission- is lower but still elevated at 1.58 today. Albumin 3.2. Per OT note cognition is returning to baseline. Oriented to person and place, able to perform ADL with supervision. Has deficits in activity tolerance, balance, cognition. Ambulated approx 200' in hallway.  On my evaluation patient was sleeping soundly, he did not wake to my voice.  I called his spouse Renay. Introduced Palliative medicine.  Palliative medicine is specialized medical care for people living with serious illness. It focuses on providing relief from the symptoms and stress of a serious illness. The goal is to improve quality of life for both the patient and the family. [name] is hopeful patient can go home tomorrow.  She plans to spend tonight with him. We made plan to meet in person tomorrow morning.     SUMMARY OF RECOMMENDATIONS   -DKA in setting of early onset dementia- improving- plan for d/c home -Meeting scheduled for tomorrow morning at 10am. If patient is discharged prior to that time- recommend referral to outpatient Palliative to see at home   Code Status/Advance Care Planning:    Code Status: Limited: Do not attempt resuscitation (DNR) -DNR-LIMITED -Do Not Intubate/DNI     Prognosis:   Unable to determine  Discharge Planning: To Be Determined  Primary Diagnoses: Present on Admission:  DKA (diabetic ketoacidosis) (HCC)   Review of Systems  Unable to perform ROS: Mental status change    Physical Exam Vitals and nursing note reviewed.  Constitutional:      General: He is not in acute distress.    Appearance: He is ill-appearing.  Neurological:     Comments: sleeping     Vital Signs: BP 131/77 (BP Location: Left Arm)   Pulse 97   Temp 98.2 F (36.8 C) (Oral)   Resp 18   Ht 5' 9 (1.753 m)   Wt 68.7 kg   SpO2 97%   BMI 22.37 kg/m  Pain Scale: 0-10   Pain Score: 0-No pain   SpO2: SpO2: 97 % O2 Device:SpO2: 97 % O2 Flow Rate: .O2 Flow Rate (L/min): 2 L/min  IO: Intake/output summary:  Intake/Output Summary (Last 24 hours) at 10/17/2024 1451 Last data filed at 10/17/2024 0935 Gross per 24 hour  Intake 1981.46 ml  Output 1500 ml  Net 481.46 ml    LBM:  Last BM Date : 10/15/24 Baseline Weight: Weight: (S) 73.8 kg Most recent weight: Weight: 68.7 kg       Thank you for this consult. Palliative medicine will continue to follow and assist as needed.  Time Total: 60 minutes Signed by: Cassondra Stain, AGNP-C Palliative Medicine  Time includes:   Preparing to see the patient (e.g., review of tests) Obtaining and/or reviewing separately obtained history Performing a medically necessary appropriate examination and/or evaluation Counseling and educating the patient/family/caregiver Ordering medications, tests, or procedures Referring and communicating with other health care professionals (when not reported separately) Documenting clinical information in the electronic or other health record Independently interpreting results (not reported separately) and communicating results to the patient/family/caregiver Care coordination (not reported  separately) Clinical documentation   Please contact Palliative Medicine Team phone at [phone] for questions and concerns.  For individual provider: See Tracey

## 2024-10-17 NOTE — Progress Notes (Signed)
 Patient transported via bed by this nurse to room

## 2024-10-17 NOTE — Progress Notes (Signed)
 Progress Note   Patient: Alan Nunez FMW:[id number] DOB: 01/01/1956 DOA: 10/15/2024     2 DOS: the patient was seen and examined on 10/17/2024   Brief hospital course: 68 year old male with a history of uncontrolled T2DM, HTN, Early onset Alzheimer's type dementia, OSA, GERD, Mitral valve disorder Ascending colitis and Arthritis who presented to the ED for shortness of breath and altered mental status. On EMS arrival, he was found to be hypotensive with systolic blood pressures in the 70s, unresponsive to initial IV fluids, and markedly hyperglycemic with capillary blood glucose >600 mg/dL.   ED Course: Initial vital signs showed HR of beats/minute, BP mm Hg, the RR 30 breaths/minute, %O2 Sat on and Temp of F (C).    Pertinent labs notable for severe DKA (glucose 1,021 mg/dL, pH 2.83, HCO? <7), hyperkalemia (>7.5 mmol/L with EKG changes), lactic acidosis (lactate 6.5-7.1), acute kidney injury (Cr 2.6), and leukocytosis (WBC up to 29). He was treated with aggressive IV fluids, insulin  infusion, calcium  gluconate, and bicarbonate. He is admitted to the ICU for management of severe DKA with shock and metabolic encephalopathy.  12/17.  Patient had a low sugar this morning but was asymptomatic.  Given some juice and food.  Will cut back insulin  this evening and put on sliding scale and monitor every 4 hours for now.  Assessment and Plan: * DKA (diabetic ketoacidosis) (HCC) Initially came in with diabetic ketoacidosis and was placed on insulin  drip was given insulin  last night but had a low sugar this morning.  Will decrease Lantus  to 10 units daily and will check fingersticks every 4 hours with lower sliding scale.  Essential hypertension Currently holding antihypertensives  Hyperkalemia Improved  Pseudohyponatremia Improved with sugars normalizing  Lactic acidosis Increased anion gap metabolic acidosis.  Patient placed on empiric antibiotics but not sure when treating         Subjective: Patient a little lethargic seen after low sugar.  Stated he slept last night.  Spoke with wife on the phone hoping he can go home tomorrow.  Mental status improved while wife was with him.  Initially admitted with diabetic ketoacidosis.  Physical Exam: Vitals:   10/17/24 0500 10/17/24 0700 10/17/24 0800 10/17/24 1103  BP: (!) 176/65 (!) 175/54 (!) 161/51 131/77  Pulse: 72 63 63 97  Resp: 16   18  Temp: 99.5 F (37.5 C) 99.5 F (37.5 C) 99.5 F (37.5 C) 98.2 F (36.8 C)  TempSrc:   Rectal Oral  SpO2: 99% 91% 93% 97%  Weight: 68.7 kg     Height:       Physical Exam HENT:     Head: Normocephalic.  Eyes:     General: Lids are normal.     Conjunctiva/sclera: Conjunctivae normal.  Cardiovascular:     Rate and Rhythm: Normal rate and regular rhythm.     Heart sounds: Normal heart sounds, S1 normal and S2 normal.  Pulmonary:     Breath sounds: No decreased breath sounds, wheezing, rhonchi or rales.  Abdominal:     Palpations: Abdomen is soft.     Tenderness: There is no abdominal tenderness.  Musculoskeletal:     Right lower leg: No swelling.     Left lower leg: No swelling.  Skin:    General: Skin is warm.     Findings: No rash.  Neurological:     Mental Status: He is alert.     Data Reviewed: Creatinine 1.58, AST 73, ALT 59, white blood cell count 14.3, hemoglobin  11.2, platelet count 161  Family Communication:  spoke with wife on the phone  Disposition: Status is: Inpatient Remains inpatient appropriate because: With very low sugar this morning will watch another day in the hospital.  Decrease insulin   Planned Discharge Destination: Home    Time spent: 28 minutes  Author: Charlie Patterson, MD 10/17/2024 1:11 PM  For on call review [email or web address].

## 2024-10-17 NOTE — Evaluation (Signed)
 Occupational Therapy Evaluation Patient Details Name: Alan Nunez MRN: [id number] DOB: Dec 30, 1955 Today's Date: 10/17/2024   History of Present Illness   68 y/o male presented to ED on 10/15/24 for SOB and AMS. Admitted for severe DKA. PMH: uncontrolled T2DM, early onset Alzheimer's, OSA, mitral valve disorder     Clinical Impressions Chart reviewed to date, pt greeted semi supine in bed, oriented to self and place. He requires multi modal cues fo rone step directives. Pt wife reports his cognition appears close to his current baseline at home. PTA he performs ADL/mobility with supervision-MOD I per wife. Pt presents with deficits in activity tolerance, balance, cognition, affecting safe and optimal ADL completion. He amb in hallway approx 200' with MIN A, progress to CGA. Feeding/grooming tasks with supervision. OT will follow acutely to facilitate optimal ADL/functional mobility performance. Pt is left as received, all needs met.      If plan is discharge home, recommend the following:   A little help with walking and/or transfers;A little help with bathing/dressing/bathroom;Supervision due to cognitive status     Functional Status Assessment   Patient has had a recent decline in their functional status and demonstrates the ability to make significant improvements in function in a reasonable and predictable amount of time.     Equipment Recommendations   None recommended by OT     Recommendations for Other Services         Precautions/Restrictions   Precautions Precautions: Fall Recall of Precautions/Restrictions: Impaired Restrictions Weight Bearing Restrictions Per Provider Order: No     Mobility Bed Mobility Overal bed mobility: Needs Assistance Bed Mobility: Supine to Sit, Sit to Supine     Supine to sit: Supervision, HOB elevated Sit to supine: Supervision        Transfers   Equipment used: 1 person hand held assist Transfers: Sit to/from  Stand Sit to Stand: Min assist, Contact guard assist                  Balance Overall balance assessment: Needs assistance Sitting-balance support: Feet supported, No upper extremity supported Sitting balance-Leahy Scale: Good     Standing balance support: Single extremity supported, No upper extremity supported, During functional activity Standing balance-Leahy Scale: Fair                             ADL either performed or assessed with clinical judgement   ADL Overall ADL's : Needs assistance/impaired Eating/Feeding: Supervision/ safety                   Lower Body Dressing: Supervision/safety   Toilet Transfer: Ambulation;Contact guard assist;Minimal assistance Toilet Transfer Details (indicate cue type and reason): simulated, MIN A, progress to CGA Toileting- Clothing Manipulation and Hygiene: Maximal assistance       Functional mobility during ADLs: Contact guard assist;Minimal assistance (approx 200')       Vision Patient Visual Report: No change from baseline       Perception         Praxis         Pertinent Vitals/Pain Pain Assessment Pain Assessment: PAINAD Breathing: normal Negative Vocalization: none Facial Expression: smiling or inexpressive Body Language: relaxed Consolability: no need to console PAINAD Score: 0     Extremity/Trunk Assessment Upper Extremity Assessment Upper Extremity Assessment: Overall WFL for tasks assessed   Lower Extremity Assessment Lower Extremity Assessment: Defer to PT evaluation   Cervical / Trunk Assessment Cervical /  Trunk Assessment: Kyphotic   Communication Communication Communication: Impaired Factors Affecting Communication: Reduced clarity of speech (garbled speech at times)   Cognition Arousal: Alert Behavior During Therapy: Impulsive Cognition: History of cognitive impairments             OT - Cognition Comments: oriented to self and to place, requires cueing for  initiation, wife reports his cognition appears close to his current baseline                 Following commands: Impaired Following commands impaired: Follows one step commands inconsistently, Follows one step commands with increased time (improved with multi modal cueing)     Cueing  General Comments   Cueing Techniques: Verbal cues;Gestural cues  vss   Exercises Other Exercises Other Exercises: edu pt/wife re role of OT, role of rehab, discharge recommendations   Shoulder Instructions      Home Living Family/patient expects to be discharged to:: Private residence Living Arrangements: Spouse/significant other Available Help at Discharge: Family;Available 24 hours/day Type of Home: Mobile home Home Access: Stairs to enter Entrance Stairs-Number of Steps: 2 Entrance Stairs-Rails: Right Home Layout: One level     Bathroom Shower/Tub: Chief Strategy Officer: Standard     Home Equipment: [employer] - single point   Additional Comments: wife available prn/intermittently      Prior Functioning/Environment Prior Level of Function : Independent/Modified Independent             Mobility Comments: amb with no AD, one recent fall outside per wife ADLs Comments: supervision-MOD I for ADL per wife; assist for IADLs due to cognition    OT Problem List: Decreased activity tolerance;Impaired balance (sitting and/or standing);Decreased knowledge of use of DME or AE;Decreased safety awareness;Decreased cognition;Decreased coordination   OT Treatment/Interventions: Self-care/ADL training;Therapeutic exercise;Energy conservation;DME and/or AE instruction;Balance training;Therapeutic activities      OT Goals(Current goals can be found in the care plan section)   Acute Rehab OT Goals Patient Stated Goal: go home OT Goal Formulation: With patient/family Time For Goal Achievement: 10/31/24 Potential to Achieve Goals: Good ADL Goals Pt Will Perform Grooming: with  supervision Pt Will Perform Lower Body Dressing: with supervision Pt Will Transfer to Toilet: with supervision;ambulating Pt Will Perform Toileting - Clothing Manipulation and hygiene: with supervision   OT Frequency:  Min 2X/week    Co-evaluation PT/OT/SLP Co-Evaluation/Treatment: Yes Reason for Co-Treatment: To address functional/ADL transfers;Necessary to address cognition/behavior during functional activity PT goals addressed during session: Mobility/safety with mobility OT goals addressed during session: ADL's and self-care      AM-PAC OT 6 Clicks Daily Activity     Outcome Measure Help from another person eating meals?: None Help from another person taking care of personal grooming?: None Help from another person toileting, which includes using toliet, bedpan, or urinal?: A Little Help from another person bathing (including washing, rinsing, drying)?: A Little Help from another person to put on and taking off regular upper body clothing?: None Help from another person to put on and taking off regular lower body clothing?: A Little 6 Click Score: 21   End of Session Equipment Utilized During Treatment: Gait belt Nurse Communication: Mobility status  Activity Tolerance: Patient tolerated treatment well Patient left: in bed;with call bell/phone within reach;with bed alarm set;with family/visitor present  OT Visit Diagnosis: Other abnormalities of gait and mobility (R26.89);Other symptoms and signs involving cognitive function                Time: 8843-8785 OT  Time Calculation (min): 18 min Charges:  OT General Charges $OT Visit: 1 Visit OT Evaluation $OT Eval Moderate Complexity: 1 Mod Therisa Sheffield, OTD OTR/L  10/17/2024, 1:34 PM

## 2024-10-17 NOTE — Inpatient Diabetes Management (Addendum)
 Inpatient Diabetes Program Recommendations  AACE/ADA: New Consensus Statement on Inpatient Glycemic Control  Target Ranges:  Prepandial:   less than 140 mg/dL      Peak postprandial:   less than 180 mg/dL (1-2 hours)      Critically ill patients:  140 - 180 mg/dL    Latest Reference Range & Units 10/16/24 17:18 10/16/24 18:18 10/16/24 19:10 10/16/24 20:08 10/16/24 21:15 10/16/24 23:58 10/17/24 03:57 10/17/24 08:20  Glucose-Capillary 70 - 99 mg/dL 816 (H) 843 (H) 842 (H) 161 (H) 150 (H) 124 (H) 88 35 (LL)    Latest Reference Range & Units 10/15/24 22:02 10/16/24 01:54 10/16/24 04:18 10/16/24 08:04 10/16/24 12:28 10/16/24 16:47 10/16/24 20:35  Beta-Hydroxybutyric Acid 0.05 - 0.27 mmol/L >8.00 (H) >8.00 (H) >8.00 (H) 7.42 (H) 3.01 (H) 0.72 (H) 0.21    Latest Reference Range & Units 10/15/24 22:02  CO2 22 - 32 mmol/L <7 (L)  Glucose 70 - 99 mg/dL 8,978 (HH)  Anion gap 5 - 15  NOT CALCULATED   Review of Glycemic Control  Diabetes history: DM2 Outpatient Diabetes medications: Tresiba  30 unist daily, Novolog  0-16 units TID with meals, FreeStyle Libre 3 Plus CGM Current orders for Inpatient glycemic control: Lantus  20 units at bedtime, Novolog  0-15 units TID with meals, Novolog  0-5 units QHS   Inpatient Diabetes Program Recommendations:     Insulin : Patient received Lantus  20 units at 22:01 on 10/16/24 and Novolog  3 units at 00:13 on 12/17.  CBG 35 mg/dl this morning. Please consider decreasing Lantus  to 10 units at bedtime, changing CBGs to Q4H, and Novolog  correction to 0-6 units Q4H.    Outpatient DM: Would recommend that patient's wife administer insulin  injections and watch patient take oral medications that she provides to him.  NOTE: Patient admitted with severe DKA, sepsis, AKI, severe AGMA with lactic acidosis, and acute metabolic encephalopathy. Per chart review, patient sees Dr. Cherilyn (Endocrinologist) for DM2 management and was last seen on 08/22/24. Per office note on 08/22/24  patient was advised to:    Keep taking your Metformin. Let's increase your Tresiba  to 30 units once a day. Keep taking the novolog  sliding scale with meals If under 120, take 4 units If 120-200, Take 8 units If 201-300, take 12 unit  If 301-400, take 16 units  If over 400 take 20 units. **You need to take the 4 units even if under 120 if you are eating a full meal, otherwise you'll go up to 300 after the meal and stay there for several hours.   Addendum 10/17/24@11 :35-Spoke with patient's wife at bedside. Patient sleeping but opened eyes to name and then closed them again. Patient's wife states that she gets patient's medications ready and patient takes his insulin  himself and takes oral medications himself (that she provides to him). She reports that patient was taking Tresiba  30 units daily, Metformin 1000 mg BID, and he did not take the Novolog  insulin  very often. Patient uses a FreeStyle Libre 3 Plus CGM sensor for glucose monitoring. Patient's wife states that she does not watch patient take his medications but he tells her he took it when asked. She states that patient sees Dr. Cherilyn and she plans to talk to him again about patient going on the OmniPod insulin  pump to see if that will help with DM control. She states patient use to be on the V-Go insulin  pump but he was having lows so it was stopped and he went to taking injections. She reports that she is able  to see patient's glucose values on his Libre3. Discussed that initial glucose was 1021 mg/dl so patient likely not taking insulin  consistently or correctly. Discussed that given his dementia, it is probably best if she administers patient his insulin  and that she watches him take his oral medications. Encouraged patient's wife to reach out to Dr. Kathie office to follow up and discuss the OmniPod insulin  pump if that is what she would like patient to get started on.  Patient's wife states patient has all needed DM medications and  supplies at home. She verbalized understanding of information and has no questions at this time.  Thanks, Earnie Gainer, RN, MSN, CDCES Diabetes Coordinator Inpatient Diabetes Program [phone] (Team Pager from 8am to 5pm)

## 2024-10-17 NOTE — Progress Notes (Signed)
 PHARMACY - PHYSICIAN COMMUNICATION CRITICAL VALUE ALERT - BLOOD CULTURE IDENTIFICATION (BCID)  Alan Nunez is an 68 y.o. male who presented to [facility] on 10/15/2024 with a chief complaint of altered mental status and hyperglycemia  Assessment:  12/15 blood cultures with GPR in 1 of 4 bottles, no BCID performed in these cases.    Name of physician (or Provider) Contacted: Dr Josette  Current antibiotics: ampicillin gabino for possible aspiration pneumonia  Changes to prescribed antibiotics recommended:  Patient is on recommended antibiotics - No changes needed at this time.  Monitor on current antibiotics  No results found for this or any previous visit.  Alan Nunez, PharmD, BCPS, BCIDP Work Cell: [phone] 10/17/2024 3:00 PM

## 2024-10-17 NOTE — Assessment & Plan Note (Addendum)
 Increased anion gap metabolic acidosis.  Patient placed on empiric antibiotics but not sure when treating.  1 blood culture bottle growing gram-positive rods.  This is likely contaminant but will give Augmentin  upon going home.  Received Unasyn  while here.

## 2024-10-17 NOTE — Assessment & Plan Note (Signed)
 Improved with sugars normalizing

## 2024-10-17 NOTE — Plan of Care (Signed)
  Problem: Clinical Measurements: Goal: Ability to maintain clinical measurements within normal limits will improve Outcome: Progressing Goal: Diagnostic test results will improve Outcome: Progressing Goal: Respiratory complications will improve Outcome: Progressing Goal: Cardiovascular complication will be avoided Outcome: Progressing   Problem: Nutrition: Goal: Adequate nutrition will be maintained Outcome: Progressing   Problem: Coping: Goal: Level of anxiety will decrease Outcome: Progressing   Problem: Elimination: Goal: Will not experience complications related to urinary retention Outcome: Progressing   Problem: Pain Managment: Goal: General experience of comfort will improve and/or be controlled Outcome: Progressing   Problem: Education: Goal: Knowledge of General Education information will improve Description: Including pain rating scale, medication(s)/side effects and non-pharmacologic comfort measures Outcome: Not Progressing

## 2024-10-17 NOTE — Progress Notes (Signed)
 PHARMACY CONSULT NOTE - FOLLOW UP  Pharmacy Consult for Electrolyte Monitoring and Replacement   Recent Labs: Potassium (mmol/L)  Date Value  10/17/2024 3.5  12/14/2011 4.0   Magnesium (mg/dL)  Date Value  87/82/7974 2.4   Calcium  (mg/dL)  Date Value  87/82/7974 9.8   Calcium , Total (mg/dL)  Date Value  97/87/7986 9.4   Albumin (g/dL)  Date Value  87/82/7974 3.2 (L)  12/14/2011 4.3   Phosphorus (mg/dL)  Date Value  87/82/7974 2.0 (L)   Sodium (mmol/L)  Date Value  10/17/2024 141  12/14/2011 136    Assessment: 68 year old male with a history of uncontrolled T2DM, HTN, Early onset Alzheimer's type dementia, OSA, GERD, Mitral valve disorder Ascending colitis and Arthritis who presented to the ED for shortness of breath and altered mental status. Pharmacy is asked to follow and replace electrolytes while in CCU  Goal of Therapy:  Electrolytes WNL  Plan:  15 mmol IV potassium phosphate  x 1 (contains 22 mEq IV potassium) per NP Because this consult was generated as part of an ICU order set and patient is transferring pharmacy will sign off for now Please feel free to reach out if any further assistance is needed   Adriana JONETTA Bolster ,PharmD Clinical Pharmacist 10/17/2024 7:06 AM

## 2024-10-17 NOTE — Progress Notes (Signed)
 Mobility Specialist - Progress Note   10/17/24 1437  Mobility  Activity Ambulated with assistance;Stood at bedside  Level of Assistance Contact guard assist, steadying assist  Assistive Device None (pushing IV pole)  Distance Ambulated (ft) 24 ft  Activity Response Tolerated well  Mobility visit 1 Mobility  Mobility Specialist Start Time (ACUTE ONLY) 1355  Mobility Specialist Stop Time (ACUTE ONLY) 1404  Mobility Specialist Time Calculation (min) (ACUTE ONLY) 9 min   MS responding to bed alarm. Pt standing at end of the bed with IV stretched to the [name]. Pt required CGA-MinG to amb to/from the bathroom. Multimodal cueing required for Pt to sit and return to bed. Pt left supine with alarm set and needs within reach.   Alan Nunez Mobility Specialist 10/17/2024 2:44 PM

## 2024-10-18 DIAGNOSIS — F0390 Unspecified dementia without behavioral disturbance: Secondary | ICD-10-CM | POA: Insufficient documentation

## 2024-10-18 DIAGNOSIS — E1010 Type 1 diabetes mellitus with ketoacidosis without coma: Secondary | ICD-10-CM | POA: Diagnosis not present

## 2024-10-18 DIAGNOSIS — I10 Essential (primary) hypertension: Secondary | ICD-10-CM | POA: Diagnosis not present

## 2024-10-18 DIAGNOSIS — E875 Hyperkalemia: Secondary | ICD-10-CM | POA: Diagnosis not present

## 2024-10-18 LAB — BASIC METABOLIC PANEL WITH GFR
Anion gap: 8 (ref 5–15)
BUN: 22 mg/dL (ref 8–23)
CO2: 30 mmol/L (ref 22–32)
Calcium: 8.7 mg/dL — ABNORMAL LOW (ref 8.9–10.3)
Chloride: 104 mmol/L (ref 98–111)
Creatinine, Ser: 1.07 mg/dL (ref 0.61–1.24)
GFR, Estimated: >60 mL/min (ref >60)
Glucose, Bld: 153 mg/dL — ABNORMAL HIGH (ref 70–99)
Potassium: 3.5 mmol/L (ref 3.5–5.1)
Sodium: 142 mmol/L (ref 135–145)

## 2024-10-18 LAB — CBC
HCT: 28.5 % — ABNORMAL LOW (ref 39.0–52.0)
Hemoglobin: 9.3 g/dL — ABNORMAL LOW (ref 13.0–17.0)
MCH: 30.8 pg (ref 26.0–34.0)
MCHC: 32.6 g/dL (ref 30.0–36.0)
MCV: 94.4 fL (ref 80.0–100.0)
Platelets: 109 K/uL — ABNORMAL LOW (ref 150–400)
RBC: 3.02 MIL/uL — ABNORMAL LOW (ref 4.22–5.81)
RDW: 13.5 % (ref 11.5–15.5)
WBC: 7 K/uL (ref 4.0–10.5)
nRBC: 0 % (ref 0.0–0.2)

## 2024-10-18 LAB — GLUCOSE, CAPILLARY
Glucose-Capillary: 100 mg/dL — ABNORMAL HIGH (ref 70–99)
Glucose-Capillary: 124 mg/dL — ABNORMAL HIGH (ref 70–99)
Glucose-Capillary: 161 mg/dL — ABNORMAL HIGH (ref 70–99)
Glucose-Capillary: 206 mg/dL — ABNORMAL HIGH (ref 70–99)

## 2024-10-18 LAB — LEGIONELLA PNEUMOPHILA SEROGP 1 UR AG: L. pneumophila Serogp 1 Ur Ag: NEGATIVE

## 2024-10-18 MED ORDER — INSULIN ASPART 100 UNIT/ML FLEXPEN: 0.0000 [IU] | PEN_INJECTOR | Freq: Three times a day (TID) | SUBCUTANEOUS | 0 refills | Status: AC

## 2024-10-18 MED ORDER — INSULIN PEN NEEDLE 33G X 5 MM MISC: 1.0000 | Freq: Three times a day (TID) | 0 refills | Status: AC

## 2024-10-18 MED ORDER — INSULIN DEGLUDEC 100 UNIT/ML ~~LOC~~ SOPN: 13.0000 [IU] | PEN_INJECTOR | Freq: Every day | SUBCUTANEOUS | Status: AC

## 2024-10-18 MED ORDER — AMOXICILLIN-POT CLAVULANATE 875-125 MG PO TABS: 1.0000 | ORAL_TABLET | Freq: Two times a day (BID) | ORAL | 0 refills | Status: AC

## 2024-10-18 MED ORDER — INSULIN ASPART 100 UNIT/ML FLEXPEN: 0.0000 [IU] | PEN_INJECTOR | Freq: Three times a day (TID) | SUBCUTANEOUS | Status: DC

## 2024-10-18 MED ORDER — AMOXICILLIN-POT CLAVULANATE 875-125 MG PO TABS
1.0000 | ORAL_TABLET | Freq: Two times a day (BID) | ORAL | Status: DC
  Administered 2024-10-18: 10:00:00 1 via ORAL
  Filled 2024-10-18: qty 1

## 2024-10-18 NOTE — Inpatient Diabetes Management (Signed)
 Inpatient Diabetes Program Recommendations  AACE/ADA: New Consensus Statement on Inpatient Glycemic Control   Target Ranges:  Prepandial:   less than 140 mg/dL      Peak postprandial:   less than 180 mg/dL (1-2 hours)      Critically ill patients:  140 - 180 mg/dL    Latest Reference Range & Units 10/17/24 08:37 10/17/24 11:01 10/17/24 15:52 10/17/24 20:00 10/18/24 00:12 10/18/24 03:41 10/18/24 08:10 10/18/24 08:34  Glucose-Capillary 70 - 99 mg/dL 89 821 (H) 765 (H) 738 (H) 206 (H) 161 (H) 100 (H) 124 (H)    Latest Reference Range & Units 10/17/24 05:23  Hemoglobin A1C 4.8 - 5.6 % 12.3 (H)   Review of Glycemic Control  Diabetes history: DM2 Outpatient Diabetes medications: Tresiba  30 unist daily, Novolog  0-16 units TID with meals, FreeStyle Libre 3 Plus CGM Current orders for Inpatient glycemic control: Lantus  10 units at bedtime, Novolog  0-6 units Q4H    Inpatient Diabetes Program Recommendations:    Outpatient DM: Recommend to discharge home on Tresiba  13 units Q24H (based on 66.3 kg x 0.2 units) and Novolog  correction 0-6 units AC&HS; encourage pt's wife to reach out to Dr. Kathie office to follow up early next week if possible.  Please provide Rx for Novolog  pens [phone]).  Addendum 10/18/24@9 :38-Spoke with patient's wife at bedside regarding insulin . Discussed insulin  storage, injection sites, rotating injection sites, and reviewed how to use insulin  pens. She reports that patient has Tresiba  insulin  pens at home and Novolog  vials. She would prefer to have Novolog  pens so will ask attending provider to give Rx for Novolog  pens at discharge. Educated patient's wife on insulin  pen use at home. Reviewed all steps of insulin  pen including attachment of needle, 2-unit air shot, dialing up dose, giving injection, removing needle, disposal of sharps, storage of unused insulin , disposal of insulin  etc. Patient's wife able to provide successful return demonstration. She reports that  patient uses the [employer] reader for glucose monitoring. Discussed how to use Novolog  correction scale and discussed Novolog  and Tresiba  insulin  in detail. Asked her to use the [facility] reader to document when insulin  is taken and how much if possible so Endocrinologist can use the information to adjust insulin  if needed. She reports she called this morning and has an appointment with Dr. Cherilyn on 11/07/24 for patient to follow up. She verbalized understanding of information and has no questions at this time.  Thanks, Earnie Gainer, RN, MSN, CDCES Diabetes Coordinator Inpatient Diabetes Program [phone] (Team Pager from 8am to 5pm)

## 2024-10-18 NOTE — Progress Notes (Addendum)
 Discharge instructions were reviewed with patient and family. Questions were encouraged and answered. IV was removed by NT. Belongings collected by patient's family

## 2024-10-18 NOTE — Assessment & Plan Note (Signed)
 Follow up as outpatient

## 2024-10-18 NOTE — Discharge Summary (Signed)
 Physician Discharge Summary   Patient: Alan Nunez MRN: [id number] DOB: 11-09-1955  Admit date:     10/15/2024  Discharge date: 10/18/2024  Discharge Physician: Charlie Patterson   PCP: Sherial Bail, MD   Recommendations at discharge:   Follow-up PCP 5 days  Discharge Diagnoses: Principal Problem:   DKA (diabetic ketoacidosis) (HCC) Active Problems:   Goals of care, counseling/discussion   Lactic acidosis   Pseudohyponatremia   Hyperkalemia   Essential hypertension   Dementia without behavioral disturbance [facility])    Hospital Course: 68 year old male with a history of uncontrolled T2DM, HTN, Early onset Alzheimer's type dementia, OSA, GERD, Mitral valve disorder Ascending colitis and Arthritis who presented to the ED for shortness of breath and altered mental status. On EMS arrival, he was found to be hypotensive with systolic blood pressures in the 70s, unresponsive to initial IV fluids, and markedly hyperglycemic with capillary blood glucose >600 mg/dL.   ED Course: Initial vital signs showed HR of beats/minute, BP mm Hg, the RR 30 breaths/minute, %O2 Sat on and Temp of F (C).    Pertinent labs notable for severe DKA (glucose 1,021 mg/dL, pH 2.83, HCO? <7), hyperkalemia (>7.5 mmol/L with EKG changes), lactic acidosis (lactate 6.5-7.1), acute kidney injury (Cr 2.6), and leukocytosis (WBC up to 29). He was treated with aggressive IV fluids, insulin  infusion, calcium  gluconate, and bicarbonate. He is admitted to the ICU for management of severe DKA with shock and metabolic encephalopathy.  12/17.  Patient had a low sugar this morning but was asymptomatic.  Given some juice and food.  Will cut back insulin  this evening and put on sliding scale and monitor every 4 hours for now. 12/18.  Patient feeling fine.  Offers no complaints.  Likely blood cultures are contaminant but will give Augmentin  to go home with.  Diabetes coordinator recommended long-acting insulin  13 units  nightly and short acting insulin  sliding scale prior to meals.  Assessment and Plan: * DKA (diabetic ketoacidosis) (HCC) Initially came in with diabetic ketoacidosis and was placed on insulin  drip was given insulin  last night but had a low sugar yesterday morning.  Patient feeling much better today.  Case discussed with diabetes coordinator recommended 13 units of Tresiba  insulin  at night and sliding scale insulin  for short acting.  Diabetes coordinator came to teach wife how to inject insulin  also.  Dementia without behavioral disturbance (HCC) Follow-up as outpatient.  Essential hypertension Initially blood pressure medications were held but can be restarted as outpatient.  Hyperkalemia Improved  Pseudohyponatremia Improved with sugars normalizing  Lactic acidosis Increased anion gap metabolic acidosis.  Patient placed on empiric antibiotics but not sure when treating.  1 blood culture bottle growing gram-positive rods.  This is likely contaminant but will give Augmentin  upon going home.  Received Unasyn  while here.         Consultants: Was on critical care service Procedures performed: None Disposition: Home health Diet recommendation:  Cardiac and Carb modified diet DISCHARGE MEDICATION: Allergies as of 10/18/2024       Reactions   Codeine Other (See Comments)   hallucinations   Prednisone Other (See Comments)   insomnia   Morphine And Codeine Hives, Rash        Medication List     STOP taking these medications    amLODipine 10 MG tablet Commonly known as: NORVASC       TAKE these medications    acetaminophen  650 MG CR tablet Commonly known as: TYLENOL  Take 650 mg by mouth every  8 (eight) hours as needed for pain.   amoxicillin -clavulanate 875-125 MG tablet Commonly known as: AUGMENTIN  Take 1 tablet by mouth every 12 (twelve) hours for 5 days.   cholecalciferol 25 MCG (1000 UNIT) tablet Commonly known as: VITAMIN D3 Take 1,000 Units by mouth  daily.   donepezil  10 MG tablet Commonly known as: ARICEPT  Take 10 mg by mouth daily.   ferrous sulfate 325 (65 FE) MG tablet Take 325 mg by mouth daily with breakfast.   Fish Oil 1000 MG Caps Take 1 capsule by mouth daily.   FreeStyle Libre 3 Plus Sensor Misc 1 Daily on Weekdays   furosemide 20 MG tablet Commonly known as: LASIX Take 20 mg by mouth daily.   insulin  aspart 100 UNIT/ML FlexPen Commonly known as: NOVOLOG  Inject 0-6 Units into the skin 3 (three) times daily with meals. Check Blood Glucose (BG) and inject per scale: BG <150= 0 unit; BG 150-200= 1 unit; BG 201-250= 2 unit; BG 251-300= 3 unit; BG 301-350= 4 unit; BG 351-400= 5 unit; BG >400= 6 unit and Call Primary Care. What changed:  how much to take how to take this when to take this additional instructions   insulin  degludec 100 UNIT/ML FlexTouch Pen Commonly known as: TRESIBA  Inject 13 Units into the skin at bedtime. What changed:  how much to take when to take this   Insulin  Pen Needle 33G X 5 MM Misc 1 Dose by Does not apply route 3 (three) times daily before meals.   losartan-hydrochlorothiazide 100-12.5 MG tablet Commonly known as: HYZAAR Take 1 tablet by mouth daily.   metFORMIN 1000 MG tablet Commonly known as: GLUCOPHAGE Take 1,000 mg by mouth 2 (two) times daily with a meal.   multivitamin with minerals Tabs tablet Take 1 tablet by mouth daily.   OVER THE COUNTER MEDICATION Take 1 tablet by mouth 2 (two) times daily. Brain support   polyethylene glycol 17 g packet Commonly known as: MIRALAX  / GLYCOLAX  Take 17 g by mouth daily as needed for moderate constipation.   rosuvastatin 10 MG tablet Commonly known as: CRESTOR Take 10 mg by mouth daily.        Contact information for follow-up providers     Sherial Bail, MD Follow up in 5 day(s).   Specialty: Internal Medicine Why: 10/23/24 10:30 AM Myer Manus ([place]) If appointment needs to be rescheduled, please call [phone] Contact information: [address] [place] [address] [phone]              Contact information for after-discharge care     Home Medical Care     [facility] .   Service: Home Health Services Contact information: [address] [place]  [address] [phone]                    Discharge Exam: Filed Weights   10/16/24 0500 10/17/24 0500 10/18/24 0408  Weight: 68.5 kg 68.7 kg 66.3 kg   Physical Exam HENT:     Head: Normocephalic.  Eyes:     General: Lids are normal.     Conjunctiva/sclera: Conjunctivae normal.  Cardiovascular:     Rate and Rhythm: Normal rate and regular rhythm.     Heart sounds: Normal heart sounds, S1 normal and S2 normal.  Pulmonary:     Breath sounds: No decreased breath sounds, wheezing, rhonchi or rales.  Abdominal:     Palpations: Abdomen is soft.     Tenderness: There is  no abdominal tenderness.  Musculoskeletal:     Right lower leg: No swelling.     Left lower leg: No swelling.  Skin:    General: Skin is warm.     Findings: No rash.  Neurological:     Mental Status: He is alert.      Condition at discharge: stable  The results of significant diagnostics from this hospitalization (including imaging, microbiology, ancillary and laboratory) are listed below for reference.   Imaging Studies: CT CHEST ABDOMEN PELVIS WO CONTRAST Result Date: 10/16/2024 EXAM: CT CHEST, ABDOMEN AND PELVIS WITHOUT CONTRAST 10/16/2024 03:18:01 AM TECHNIQUE: CT of the chest, abdomen and pelvis was performed without the administration of intravenous contrast. Multiplanar reformatted images are provided for review. Automated exposure control, iterative reconstruction, and/or weight based adjustment of the mA/kV was utilized to reduce the radiation dose to as low as reasonably achievable. COMPARISON: 11/25/2022. CLINICAL HISTORY: Possible sepsis. FINDINGS: CHEST: MEDIASTINUM AND LYMPH NODES: Heart  and pericardium are unremarkable. No cardiac enlargement is seen. The central airways are clear. No mediastinal, hilar or axillary lymphadenopathy. The aorta shows atherosclerotic calcifications without aneurysmal dilatation. LUNGS AND PLEURA: The lungs are well aerated bilaterally. No focal infiltrate or sizable effusion is noted. No parenchymal nodules are seen. No pneumothorax. ABDOMEN AND PELVIS: LIVER: Liver is within normal limits. GALLBLADDER AND BILE DUCTS: The gallbladder has been surgically removed. No biliary ductal dilatation. SPLEEN: No acute abnormality. PANCREAS: No acute abnormality. ADRENAL GLANDS: No acute abnormality. KIDNEYS, URETERS AND BLADDER: Scattered small nonobstructing renal calculi are noted bilaterally. No hydronephrosis. No perinephric or periureteral stranding. The urinary bladder is within normal limits. GI AND BOWEL: Stomach and small bowel are within normal limits. No obstruction inflammatory changes of the colon are seen. Diverticulosis is noted without diverticulitis. The appendix is within normal limits. REPRODUCTIVE ORGANS: The prostate is within normal limits. PERITONEUM AND RETROPERITONEUM: No ascites. No free air. VASCULATURE: Aortic calcifications are noted without aneurysmal dilatation. ABDOMINAL AND PELVIS LYMPH NODES: No lymphadenopathy. BONES AND SOFT TISSUES: Postsurgical changes are noted in the cervical spine. No acute bony abnormality in the chest is seen. No acute bony abnormality is noted in the abdomen and pelvis. No focal soft tissue abnormality. IMPRESSION: 1. No acute abnormality in the chest, abdomen, or pelvis to explain sepsis. 2. Nonobstructing small  renal calculi bilaterally. Electronically signed by: Oneil Devonshire MD 10/16/2024 03:33 AM EST RP Workstation: [id number]    Microbiology: Results for orders placed or performed during the hospital encounter of 10/15/24  MRSA Next Gen by PCR, Nasal     Status: None   Collection Time: 10/15/24 12:22 AM    Specimen: Nasal Mucosa; Nasal Swab  Result Value Ref Range Status   MRSA by PCR Next Gen NOT DETECTED NOT DETECTED Final    Comment: (NOTE) The GeneXpert MRSA Assay (FDA approved for NASAL specimens only), is one component of a comprehensive MRSA colonization surveillance program. It is not intended to diagnose MRSA infection nor to guide or monitor treatment for MRSA infections. Test performance is not FDA approved in patients less than 75 years old. Performed at [facility], [address] [place], [place] [address]   Culture, blood (routine x 2)     Status: None (Preliminary result)   Collection Time: 10/15/24 10:02 PM   Specimen: BLOOD LEFT ARM  Result Value Ref Range Status   Specimen Description   Final    BLOOD LEFT ARM Performed at [facility], [address], [place], [place]  [address]    Special Requests   Final    BOTTLES DRAWN AEROBIC AND ANAEROBIC Blood Culture adequate volume Performed at [facility], [address] [place], [place] [address]    Culture  Setup Time   Final    GRAM POSITIVE RODS AEROBIC BOTTLE ONLY CRITICAL RESULT CALLED TO, READ BACK BY AND VERIFIED WITH: MADISON HUNT PHARMD 10/17/2024 AT 1440 KC Performed at [facility], [address], [place], [place] [address]    Culture   Final    CULTURE REINCUBATED FOR BETTER GROWTH Performed at [facility], [address] [place], [place] [address]    Report Status PENDING  Incomplete  Culture, blood (routine x 2)     Status: None (Preliminary result)   Collection Time: 10/15/24 10:37 PM   Specimen: BLOOD  Result Value Ref Range Status   Specimen Description BLOOD BLOOD LEFT ARM  Final   Special Requests   Final    BOTTLES DRAWN AEROBIC AND ANAEROBIC Blood Culture results may not be optimal due to an inadequate volume of blood received in culture bottles   Culture   Final    NO GROWTH 3 DAYS Performed at [facility], [address]  [place], [place] [address]    Report Status PENDING  Incomplete    Labs: CBC: Recent Labs  Lab 10/15/24 2202 10/16/24 0418 10/17/24 0351 10/18/24 0429  WBC 22.5* 29.0* 14.3* 7.0  HGB 10.3* 9.7* 11.2* 9.3*  HCT 35.7* 31.5* 34.1* 28.5*  MCV 107.5* 102.6* 93.9 94.4  PLT 254 210 161 109*   Basic Metabolic Panel: Recent Labs  Lab 10/16/24 0418 10/16/24 0804 10/16/24 1228 10/16/24 1647 10/16/24 2035 10/17/24 0351 10/18/24 0429  NA 127*   < > 136 138 139 141 142  K 5.4*   < > 3.9 3.8 3.7 3.5 3.5  CL 86*   < > 96* 98 98 103 104  CO2 <7*   < > 22 28 29 29 30   GLUCOSE 776*   < > 336* 193* 171* 82 153*  BUN 51*   < > 48* 46* 44* 35* 22  CREATININE 2.45*   < > 2.13* 1.95* 1.85* 1.58* 1.07  CALCIUM  9.3   < > 10.1 10.3 9.4 9.8 8.7*  MG 2.7*  --   --   --   --  2.4  --   PHOS  --   --   --   --   --  2.0*  --    < > = values in this interval not displayed.   Liver Function Tests: Recent Labs  Lab 10/15/24 2202 10/16/24 0418 10/17/24 0351  AST 43* 41 73*  ALT 66* 58* 59*  ALKPHOS 128* 109 109  BILITOT 0.3 <0.2 <0.2  PROT 6.2* 5.3* 5.1*  ALBUMIN 4.2 3.5 3.2*   CBG: Recent Labs  Lab 10/17/24 2000 10/18/24 0012 10/18/24 0341 10/18/24 0810 10/18/24 0834  GLUCAP 261* 206* 161* 100* 124*    Discharge time spent: greater than 30 minutes.  Signed: Charlie Patterson, MD [facility] 10/18/2024

## 2024-10-18 NOTE — Progress Notes (Signed)
 [facility] Room 208 [facility] Liaison Note  Referral for outpatient palliative care received today from ICM, Corean Haddock, RN.    Referral submitted today.  Please call with any palliative care questions or concerns.  Thank you for the opportunity to participate in this patient's care.  [facility] Liaison [phone]

## 2024-10-18 NOTE — TOC Initial Note (Signed)
 Transition of Care [facility]) - Initial/Assessment Note    Patient Details  Name: Alan Nunez MRN: [id number] Date of Birth: [birth year]  Transition of Care [facility]) CM/SW Contact:    Corean ONEIDA Haddock, RN Phone Number: 10/18/2024, 10:31 AM  Clinical Narrative:                    Admitted for: DKA Admitted from: home with wife  PCP: Sherial  Current home health/prior home health/DME: [facility]  Patient A&O x1.  Assessment completed with wife.  Wife Alan Nunez will be transporting at discharge.  Therapy recommending HH.  Wife in agreement and states she does not have a preference of agency.  Accepted [facility] in the HUB, and notified Lauren with [facility].  Wife also in agreement to outpatient palliative referral and states she doesn't have a preference of agency.  Referral made to [place] with [facility]       Patient Goals and CMS Choice            Expected Discharge Plan and Services         Expected Discharge Date: 10/18/24                                    Prior Living Arrangements/Services                       Activities of Daily Living      Permission Sought/Granted                  Emotional Assessment              Admission diagnosis:  Hyperkalemia [E87.5] DKA (diabetic ketoacidosis) (HCC) [E11.10] Hypotension, unspecified hypotension type [I95.9] Type 1 diabetes mellitus with ketoacidosis without coma (HCC) [E10.10] Patient Active Problem List   Diagnosis Date Noted   Lactic acidosis 10/17/2024   Pseudohyponatremia 10/17/2024   Hyperkalemia 10/17/2024   Essential hypertension 10/17/2024   Goals of care, counseling/discussion 10/16/2024   Type 1 diabetes mellitus with ketoacidotic coma (HCC) 12/02/2022   DKA (diabetic ketoacidosis) (HCC) 11/25/2022   PCP:  Sherial Bail, MD Pharmacy:   [facility] [address] [place] [facility] [address] [place]  [address] Phone: [phone] Fax: [phone]     Social Drivers of Health (SDOH) Social History: SDOH Screenings   Food Insecurity: Patient Unable To Answer (10/17/2024)  Housing: Patient Unable To Answer (10/17/2024)  Transportation Needs: Patient Declined (10/17/2024)  Utilities: Patient Declined (10/17/2024)  Financial Resource Strain: Low Risk  (09/04/2024)   Received from [facility]  Social Connections: Unknown (10/17/2024)  Tobacco Use: Low Risk  (09/04/2024)   Received from [facility]   SDOH Interventions:     Readmission Risk Interventions     No data to display

## 2024-10-18 NOTE — Plan of Care (Signed)
 Palliative-   Palliative meeting planned for 10am this morning, however, patient in process of being discharged. I discussed outpatient Palliative referral with patient's spouse and she is in agreement. TOC order placed for referral.   Jeno Calleros, AGNP-C Palliative Medicine  No charge

## 2024-10-20 LAB — CULTURE, BLOOD (ROUTINE X 2): Culture: NO GROWTH

## 2024-10-22 LAB — CULTURE, BLOOD (ROUTINE X 2): Special Requests: ADEQUATE
